# Patient Record
Sex: Female | Born: 1935 | Race: White | Hispanic: No | Marital: Married | Smoking: Former smoker
Health system: Southern US, Community
[De-identification: ages and names within clinical notes are randomized; demographics above are authoritative.]

## PROBLEM LIST (undated history)

## (undated) DIAGNOSIS — K5090 Crohn's disease, unspecified, without complications: Secondary | ICD-10-CM

## (undated) DIAGNOSIS — E039 Hypothyroidism, unspecified: Secondary | ICD-10-CM

## (undated) HISTORY — PX: CATARACT EXTRACTION: SUR2

## (undated) HISTORY — PX: EXPLORATORY LAPAROTOMY: SUR591

## (undated) HISTORY — PX: TONSILLECTOMY: SUR1361

## (undated) HISTORY — DX: Crohn's disease, unspecified, without complications: K50.90

## (undated) HISTORY — DX: Hypothyroidism, unspecified: E03.9

## (undated) HISTORY — PX: ABDOMINAL HYSTERECTOMY: SHX81

## (undated) HISTORY — PX: APPENDECTOMY: SHX54

## (undated) HISTORY — PX: OTHER SURGICAL HISTORY: SHX169

## (undated) HISTORY — PX: BREAST BIOPSY: SHX20

---

## 2014-08-19 ENCOUNTER — Encounter (INDEPENDENT_AMBULATORY_CARE_PROVIDER_SITE_OTHER): Payer: Self-pay | Admitting: *Deleted

## 2014-09-15 ENCOUNTER — Encounter (INDEPENDENT_AMBULATORY_CARE_PROVIDER_SITE_OTHER): Payer: Self-pay | Admitting: Internal Medicine

## 2014-09-15 ENCOUNTER — Ambulatory Visit (INDEPENDENT_AMBULATORY_CARE_PROVIDER_SITE_OTHER): Payer: Medicare Other | Admitting: Internal Medicine

## 2014-09-15 VITALS — BP 132/52 | HR 80 | Temp 97.7°F | Ht 67.0 in | Wt 149.2 lb

## 2014-09-15 DIAGNOSIS — K50918 Crohn's disease, unspecified, with other complication: Secondary | ICD-10-CM

## 2014-09-15 DIAGNOSIS — K5090 Crohn's disease, unspecified, without complications: Secondary | ICD-10-CM | POA: Insufficient documentation

## 2014-09-15 DIAGNOSIS — K5010 Crohn's disease of large intestine without complications: Secondary | ICD-10-CM

## 2014-09-15 DIAGNOSIS — E039 Hypothyroidism, unspecified: Secondary | ICD-10-CM | POA: Insufficient documentation

## 2014-09-15 NOTE — Patient Instructions (Addendum)
Lab work. Will try to get Remicade infusion set up. Needs PPD.

## 2014-09-15 NOTE — Progress Notes (Addendum)
   Subjective:    Patient ID: Brenda Duncan, female    DOB: [birth year], 80 y.o.   MRN: [id number]  HPI Referred to our office by [facility] for f/u of her Crohn's. Maintained on Remicade. GI physician is Dr. Aleene DavidsonSGibson Ramppainhour who has now retired. Has been seeing Dr. Aleene DavidsonSpainhour since age 80. Hx of small bowel Crohn's. She tells me she is doing good. Has been in remission since 2008 or 2009. Presently taking Remicade every 8 weeks. She is on maintenance dose of 5mg /kg. (This is documented in her records). Appetite is good. No weight loss. There is no abdominal pain. She does occasionally have diarrhea. No BRRB or melena. Usually has 6-7 BMs a day.  Does take Imodium as needed. [facility] Past hx with ileocecal resection in 2008. Has been on Remicade since 2008 per patient. She says this is the only Crohn's medication that she has ever taken.  She thinks [facility] ? [facility]??  [facility] Dr. Cliffton Astersavid A Tendler: Abnormal barium enema raising suspicion for possible sigmoid lesion: Pathology revealed: Sigmoid polyp, Multiple hyperplastic polyps. No adenomatous mucosa is seen. B;. Ileal biopsies: Small intestinal mucosa with Lymphoid Hyperplasia. No active or chronic enteritis is seen. No granulomatous inflammation is seen.  2003 Sigmoidoscopic exam with polypectomy Dr. Ree KidaJack Spainhour: Attempted colonoscopy for surveillance for terminal ileum Crohn's Findings: Multiple polyps, rectosigmoid area, all removed by cautery and hot biopsy. Could go no further than the upper sigmoid because of looping of sigmoid.  07/05/2014 H and H 13.2 and 41.2, MCV 100.7 Albumin 4.0, ALP 68, ALT 15, AST 19, total bili 0.4, Direct bili 0.0, indirect bilirubin 0.4     Review of Systems Past Medical History  Diagnosis Date  . Crohn's disease   . Hypothyroid     Past Surgical History  Procedure Laterality Date  .  Exploratory laparotomy      1982  . Removed part of bladder      88-90  . Appendectomy    . Hysterectomy    . Cataract extraction    . Exploratory laparotomy      with ileocecal resection and en BBC small bowel resection in 2008.    Allergies not on file  No current outpatient prescriptions on file prior to visit.   No current facility-administered medications on file prior to visit.        Objective:   Physical Exam  Filed Vitals:   09/15/14 1513  Height: 5\' 7"  (1.702 m)  Weight: 149 lb 3.2 oz (67.677 kg)   Alert and oriented. Skin warm and dry. Oral mucosa is moist.   . Sclera anicteric, conjunctivae is pink. Thyroid not enlarged. No cervical lymphadenopathy. Lungs clear. Heart regular rate and rhythm.  Abdomen is soft. Bowel sounds are positive. No hepatomegaly. No abdominal masses felt. No tenderness. Multiple scars to abdomen.  No edema to lower extremities.        Assessment & Plan:  Crohn's disease maintained on Remicade.  Will try to set up infusion.  Maintenance dose 5mg /kg 149.2= 67.8kg= 339mg .  PPD need to be obtained before Remicade infusion.  CBC and sedrate today

## 2014-09-16 LAB — CBC WITH DIFFERENTIAL/PLATELET
BASOS ABS: 0.1 10*3/uL (ref 0.0–0.1)
Basophils Relative: 1 % (ref 0–1)
Eosinophils Absolute: 0.1 10*3/uL (ref 0.0–0.7)
Eosinophils Relative: 2 % (ref 0–5)
HCT: 34.4 % — ABNORMAL LOW (ref 36.0–46.0)
Hemoglobin: 11.9 g/dL — ABNORMAL LOW (ref 12.0–15.0)
LYMPHS PCT: 30 % (ref 12–46)
Lymphs Abs: 2 10*3/uL (ref 0.7–4.0)
MCH: 32.5 pg (ref 26.0–34.0)
MCHC: 34.6 g/dL (ref 30.0–36.0)
MCV: 94 fL (ref 78.0–100.0)
Monocytes Absolute: 0.4 10*3/uL (ref 0.1–1.0)
Monocytes Relative: 6 % (ref 3–12)
NEUTROS ABS: 4 10*3/uL (ref 1.7–7.7)
Neutrophils Relative %: 61 % (ref 43–77)
PLATELETS: 265 10*3/uL (ref 150–400)
RBC: 3.66 MIL/uL — ABNORMAL LOW (ref 3.87–5.11)
RDW: 13.8 % (ref 11.5–15.5)
WBC: 6.6 10*3/uL (ref 4.0–10.5)

## 2014-09-16 LAB — SEDIMENTATION RATE: Sed Rate: 16 mm/hr (ref 0–22)

## 2014-09-21 NOTE — Discharge Instructions (Signed)
Infliximab injection °What is this medicine? °INFLIXIMAB (in FLIX i mab) is used to treat Crohn's disease and ulcerative colitis. It is also used to treat ankylosing spondylitis, psoriasis, and some forms of arthritis. °This medicine may be used for other purposes; ask your health care provider or pharmacist if you have questions. °COMMON BRAND NAME(S): Remicade °What should I tell my health care provider before I take this medicine? °They need to know if you have any of these conditions: °-diabetes °-exposure to tuberculosis °-heart failure °-hepatitis or liver disease °-immune system problems °-infection °-lung or breathing disease, like COPD °-multiple sclerosis °-current or past resident of [place] °-seizure disorder °-an unusual or allergic reaction to infliximab, mouse proteins, other medicines, foods, dyes, or preservatives °-pregnant or trying to get pregnant °-breast-feeding °How should I use this medicine? °This medicine is for injection into a vein. It is usually given by a health care professional in a hospital or clinic setting. °A special MedGuide will be given to you by the pharmacist with each prescription and refill. Be sure to read this information carefully each time. °Talk to your pediatrician regarding the use of this medicine in children. Special care may be needed. °Overdosage: If you think you have taken too much of this medicine contact a poison control center or emergency room at once. °NOTE: This medicine is only for you. Do not share this medicine with others. °What if I miss a dose? °It is important not to miss your dose. Call your doctor or health care professional if you are unable to keep an appointment. °What may interact with this medicine? °Do not take this medicine with any of the following medications: °-anakinra °-rilonacept °This medicine may also interact with the following medications: °-vaccines °This list may not describe all possible interactions.  Give your health care provider a list of all the medicines, herbs, non-prescription drugs, or dietary supplements you use. Also tell them if you smoke, drink alcohol, or use illegal drugs. Some items may interact with your medicine. °What should I watch for while using this medicine? °Visit your doctor or health care professional for regular checks on your progress. °If you get a cold or other infection while receiving this medicine, call your doctor or health care professional. Do not treat yourself. This medicine may decrease your body's ability to fight infections. Before beginning therapy, your doctor may do a test to see if you have been exposed to tuberculosis. °This medicine may make the symptoms of heart failure worse in some patients. If you notice symptoms such as increased shortness of breath or swelling of the ankles or legs, contact your health care provider right away. °If you are going to have surgery or dental work, tell your health care professional or dentist that you have received this medicine. °If you take this medicine for plaque psoriasis, stay out of the sun. If you cannot avoid being in the sun, wear protective clothing and use sunscreen. Do not use sun lamps or tanning beds/booths. °What side effects may I notice from receiving this medicine? °Side effects that you should report to your doctor or health care professional as soon as possible: °-allergic reactions like skin rash, itching or hives, swelling of the face, lips, or tongue °-chest pain °-fever or chills, usually related to the infusion °-muscle or joint pain °-red, scaly patches or raised bumps on the skin °-signs of infection - fever or chills, cough, sore throat, pain or difficulty passing urine °-swollen lymph nodes   in the neck, underarm, or groin areas °-unexplained weight loss °-unusual bleeding or bruising °-unusually weak or tired °-yellowing of the eyes or skin °Side effects that usually do not require medical attention  (report to your doctor or health care professional if they continue or are bothersome): °-headache °-heartburn or stomach pain °-nausea, vomiting °This list may not describe all possible side effects. Call your doctor for medical advice about side effects. You may report side effects to FDA at 1-800-FDA-1088. °Where should I keep my medicine? °This drug is given in a hospital or clinic and will not be stored at home. °NOTE: This sheet is a summary. It may not cover all possible information. If you have questions about this medicine, talk to your doctor, pharmacist, or health care provider. °© 2015, Elsevier/Gold Standard. (2008-06-16 10:26:02) ° °

## 2014-09-22 ENCOUNTER — Encounter (HOSPITAL_COMMUNITY)
Admission: RE | Admit: 2014-09-22 | Discharge: 2014-09-22 | Disposition: A | Discharge status: Home / Self Care | Payer: Medicare Other | Source: Ambulatory Visit | Attending: Internal Medicine | Admitting: Internal Medicine

## 2014-09-22 ENCOUNTER — Encounter (HOSPITAL_COMMUNITY): Payer: Self-pay

## 2014-09-22 DIAGNOSIS — K5010 Crohn's disease of large intestine without complications: Secondary | ICD-10-CM | POA: Diagnosis not present

## 2014-09-22 MED ORDER — ACETAMINOPHEN 325 MG PO TABS
650.0000 mg | ORAL_TABLET | Freq: Once | ORAL | Status: AC
  Administered 2014-09-22: 650 mg via ORAL

## 2014-09-22 MED ORDER — ACETAMINOPHEN 325 MG PO TABS
ORAL_TABLET | ORAL | Status: AC
  Filled 2014-09-22: qty 2

## 2014-09-22 MED ORDER — SODIUM CHLORIDE 0.9 % IV SOLN
Freq: Once | INTRAVENOUS | Status: AC
  Administered 2014-09-22: 250 mL via INTRAVENOUS

## 2014-09-22 MED ORDER — LORATADINE 10 MG PO TABS
10.0000 mg | ORAL_TABLET | Freq: Once | ORAL | Status: AC
Start: 2014-09-22 — End: 2014-09-22
  Administered 2014-09-22: 10 mg via ORAL

## 2014-09-22 MED ORDER — SODIUM CHLORIDE 0.9 % IV SOLN
300.0000 mg | INTRAVENOUS | Status: DC
  Administered 2014-09-22: 300 mg via INTRAVENOUS
  Filled 2014-09-22: qty 30

## 2014-09-22 MED ORDER — LORATADINE 10 MG PO TABS
ORAL_TABLET | ORAL | Status: AC
  Filled 2014-09-22: qty 1

## 2014-09-23 ENCOUNTER — Telehealth (INDEPENDENT_AMBULATORY_CARE_PROVIDER_SITE_OTHER): Payer: Self-pay | Admitting: *Deleted

## 2014-09-23 ENCOUNTER — Telehealth (INDEPENDENT_AMBULATORY_CARE_PROVIDER_SITE_OTHER): Payer: Self-pay | Admitting: Internal Medicine

## 2014-09-23 DIAGNOSIS — E038 Other specified hypothyroidism: Secondary | ICD-10-CM

## 2014-09-23 DIAGNOSIS — K5090 Crohn's disease, unspecified, without complications: Secondary | ICD-10-CM

## 2014-09-23 NOTE — Telephone Encounter (Signed)
.  Per Delrae Rend patient will need to have labs drawn in 1 month.

## 2014-09-23 NOTE — Telephone Encounter (Signed)
Labs have been noted. 

## 2014-09-23 NOTE — Addendum Note (Signed)
Addended by: Shona NeedlesDD, Torri Langston M on: 09/23/2014 09:43 AM   Modules accepted: Orders

## 2014-09-23 NOTE — Telephone Encounter (Signed)
Needs CBC, and hepatic function, and CRP in 1 month.

## 2014-09-23 NOTE — Telephone Encounter (Addendum)
Previous lab test CRP was ordered , should have been future.

## 2014-09-29 ENCOUNTER — Encounter (INDEPENDENT_AMBULATORY_CARE_PROVIDER_SITE_OTHER): Payer: Self-pay | Admitting: *Deleted

## 2014-09-29 ENCOUNTER — Other Ambulatory Visit (INDEPENDENT_AMBULATORY_CARE_PROVIDER_SITE_OTHER): Payer: Self-pay | Admitting: *Deleted

## 2014-09-29 DIAGNOSIS — K5090 Crohn's disease, unspecified, without complications: Secondary | ICD-10-CM

## 2014-09-29 DIAGNOSIS — E038 Other specified hypothyroidism: Secondary | ICD-10-CM

## 2014-10-28 LAB — CBC
HEMATOCRIT: 37.3 % (ref 36.0–46.0)
Hemoglobin: 12.5 g/dL (ref 12.0–15.0)
MCH: 31.7 pg (ref 26.0–34.0)
MCHC: 33.5 g/dL (ref 30.0–36.0)
MCV: 94.7 fL (ref 78.0–100.0)
MPV: 9.6 fL (ref 9.4–12.4)
Platelets: 295 10*3/uL (ref 150–400)
RBC: 3.94 MIL/uL (ref 3.87–5.11)
RDW: 13.8 % (ref 11.5–15.5)
WBC: 7.6 10*3/uL (ref 4.0–10.5)

## 2014-10-28 LAB — HEPATIC FUNCTION PANEL
ALT: 15 U/L (ref 0–35)
AST: 19 U/L (ref 0–37)
Albumin: 4.3 g/dL (ref 3.5–5.2)
Alkaline Phosphatase: 73 U/L (ref 39–117)
BILIRUBIN DIRECT: 0.1 mg/dL (ref 0.0–0.3)
BILIRUBIN INDIRECT: 0.3 mg/dL (ref 0.2–1.2)
Total Bilirubin: 0.4 mg/dL (ref 0.2–1.2)
Total Protein: 7.5 g/dL (ref 6.0–8.3)

## 2014-10-28 LAB — C-REACTIVE PROTEIN

## 2014-11-02 ENCOUNTER — Encounter (INDEPENDENT_AMBULATORY_CARE_PROVIDER_SITE_OTHER): Payer: Self-pay

## 2014-11-17 ENCOUNTER — Encounter (HOSPITAL_COMMUNITY)
Admission: RE | Admit: 2014-11-17 | Discharge: 2014-11-17 | Disposition: A | Discharge status: Home / Self Care | Payer: Medicare Other | Source: Ambulatory Visit | Attending: Internal Medicine | Admitting: Internal Medicine

## 2014-11-17 DIAGNOSIS — K5010 Crohn's disease of large intestine without complications: Secondary | ICD-10-CM | POA: Diagnosis not present

## 2014-11-17 MED ORDER — SODIUM CHLORIDE 0.9 % IV SOLN
300.0000 mg | Freq: Once | INTRAVENOUS | Status: AC
  Administered 2014-11-17: 300 mg via INTRAVENOUS
  Filled 2014-11-17: qty 30

## 2014-11-17 MED ORDER — ACETAMINOPHEN 325 MG PO TABS
650.0000 mg | ORAL_TABLET | Freq: Once | ORAL | Status: AC
  Administered 2014-11-17: 650 mg via ORAL

## 2014-11-17 MED ORDER — LORATADINE 10 MG PO TABS
ORAL_TABLET | ORAL | Status: AC
  Filled 2014-11-17: qty 1

## 2014-11-17 MED ORDER — LORATADINE 10 MG PO TABS
10.0000 mg | ORAL_TABLET | Freq: Every day | ORAL | Status: DC
  Administered 2014-11-17: 10 mg via ORAL

## 2014-11-17 MED ORDER — SODIUM CHLORIDE 0.9 % IV SOLN
INTRAVENOUS | Status: DC
  Administered 2014-11-17: 250 mL via INTRAVENOUS

## 2014-11-17 MED ORDER — ACETAMINOPHEN 325 MG PO TABS
ORAL_TABLET | ORAL | Status: AC
  Filled 2014-11-17: qty 2

## 2015-01-12 ENCOUNTER — Inpatient Hospital Stay (HOSPITAL_COMMUNITY): Admission: RE | Admit: 2015-01-12 | Payer: Medicare Other | Source: Ambulatory Visit

## 2015-01-17 ENCOUNTER — Encounter (HOSPITAL_COMMUNITY)
Admission: RE | Admit: 2015-01-17 | Discharge: 2015-01-17 | Disposition: A | Discharge status: Home / Self Care | Payer: Medicare Other | Source: Ambulatory Visit | Attending: Internal Medicine | Admitting: Internal Medicine

## 2015-01-17 ENCOUNTER — Encounter (HOSPITAL_COMMUNITY): Payer: Self-pay

## 2015-01-17 DIAGNOSIS — K5010 Crohn's disease of large intestine without complications: Secondary | ICD-10-CM | POA: Insufficient documentation

## 2015-01-17 MED ORDER — LORATADINE 10 MG PO TABS
10.0000 mg | ORAL_TABLET | Freq: Once | ORAL | Status: AC
  Administered 2015-01-17: 10 mg via ORAL
  Filled 2015-01-17: qty 1

## 2015-01-17 MED ORDER — ACETAMINOPHEN 325 MG PO TABS
650.0000 mg | ORAL_TABLET | Freq: Once | ORAL | Status: AC
Start: 2015-01-17 — End: 2015-01-17
  Administered 2015-01-17: 650 mg via ORAL
  Filled 2015-01-17: qty 2

## 2015-01-17 MED ORDER — SODIUM CHLORIDE 0.9 % IV SOLN
300.0000 mg | INTRAVENOUS | Status: DC
  Administered 2015-01-17: 300 mg via INTRAVENOUS
  Filled 2015-01-17: qty 30

## 2015-01-17 MED ORDER — SODIUM CHLORIDE 0.9 % IV SOLN
Freq: Once | INTRAVENOUS | Status: AC
  Administered 2015-01-17: 09:00:00 via INTRAVENOUS

## 2015-03-14 ENCOUNTER — Encounter (HOSPITAL_COMMUNITY)
Admission: RE | Admit: 2015-03-14 | Discharge: 2015-03-14 | Disposition: A | Discharge status: Home / Self Care | Payer: Medicare Other | Source: Ambulatory Visit | Attending: Internal Medicine | Admitting: Internal Medicine

## 2015-03-14 DIAGNOSIS — K5010 Crohn's disease of large intestine without complications: Secondary | ICD-10-CM | POA: Diagnosis not present

## 2015-03-14 MED ORDER — SODIUM CHLORIDE 0.9 % IV SOLN
Freq: Once | INTRAVENOUS | Status: AC
  Administered 2015-03-14: 09:00:00 via INTRAVENOUS

## 2015-03-14 MED ORDER — SODIUM CHLORIDE 0.9 % IV SOLN
300.0000 mg | Freq: Once | INTRAVENOUS | Status: AC
  Administered 2015-03-14: 300 mg via INTRAVENOUS
  Filled 2015-03-14: qty 30

## 2015-03-17 ENCOUNTER — Encounter (INDEPENDENT_AMBULATORY_CARE_PROVIDER_SITE_OTHER): Payer: Self-pay | Admitting: Internal Medicine

## 2015-03-17 ENCOUNTER — Ambulatory Visit (INDEPENDENT_AMBULATORY_CARE_PROVIDER_SITE_OTHER): Payer: Medicare Other | Admitting: Internal Medicine

## 2015-03-17 VITALS — BP 134/70 | HR 60 | Temp 98.0°F | Ht 67.0 in | Wt 147.4 lb

## 2015-03-17 DIAGNOSIS — K5090 Crohn's disease, unspecified, without complications: Secondary | ICD-10-CM | POA: Diagnosis not present

## 2015-03-17 LAB — HEPATIC FUNCTION PANEL
ALT: 13 U/L (ref 0–35)
AST: 16 U/L (ref 0–37)
Albumin: 4.1 g/dL (ref 3.5–5.2)
Alkaline Phosphatase: 60 U/L (ref 39–117)
BILIRUBIN INDIRECT: 0.3 mg/dL (ref 0.2–1.2)
Bilirubin, Direct: 0.1 mg/dL (ref 0.0–0.3)
Total Bilirubin: 0.4 mg/dL (ref 0.2–1.2)
Total Protein: 7.1 g/dL (ref 6.0–8.3)

## 2015-03-17 LAB — CBC WITH DIFFERENTIAL/PLATELET
Basophils Absolute: 0.1 10*3/uL (ref 0.0–0.1)
Basophils Relative: 1 % (ref 0–1)
EOS ABS: 0.1 10*3/uL (ref 0.0–0.7)
EOS PCT: 2 % (ref 0–5)
HEMATOCRIT: 37.8 % (ref 36.0–46.0)
Hemoglobin: 12.6 g/dL (ref 12.0–15.0)
LYMPHS ABS: 1.9 10*3/uL (ref 0.7–4.0)
Lymphocytes Relative: 28 % (ref 12–46)
MCH: 32.1 pg (ref 26.0–34.0)
MCHC: 33.3 g/dL (ref 30.0–36.0)
MCV: 96.2 fL (ref 78.0–100.0)
MONO ABS: 0.5 10*3/uL (ref 0.1–1.0)
MPV: 9.6 fL (ref 8.6–12.4)
Monocytes Relative: 7 % (ref 3–12)
Neutro Abs: 4.2 10*3/uL (ref 1.7–7.7)
Neutrophils Relative %: 62 % (ref 43–77)
Platelets: 265 10*3/uL (ref 150–400)
RBC: 3.93 MIL/uL (ref 3.87–5.11)
RDW: 13.8 % (ref 11.5–15.5)
WBC: 6.8 10*3/uL (ref 4.0–10.5)

## 2015-03-17 LAB — C-REACTIVE PROTEIN: CRP: <0.5 mg/dL (ref <0.60)

## 2015-03-17 NOTE — Patient Instructions (Signed)
Cbc, hepatic function, CRP, OV in 6 months

## 2015-03-17 NOTE — Progress Notes (Signed)
Subjective:    Patient ID: Brenda Duncan, female    DOB: 1934-01-17, 81 y.o.   MRN: [id number]  HPI Patient is here for f/u. She was last seen in November. She was a patient of Dr. Aleene Davidson. Hx of Crohn's and maintained on Remicade. Had Remicade this past Monday. Up to date on her PPD. She h;as been in remission since 2008 or 2209. She takes Remicade every 8 weeks. Maintenance dose of 5mg /kg.  Past hx with ileocecal resection in 2008. Has been on Remicade since 2008 per patient. She says this is the only Crohn's medication that she has ever taken.  She tells me she is doing well. She denies any abdominal pain. She usually has 3-8 times a day but usually 3 times a day. No melena or BRRB. Her stools are darker since starting the iron. No weight loss. No joint pain. No rashes. She stays busy gardening.   Colonoscopy 02/15/2003 [facility] Dr. Cliffton Asters Tendler: Abnormal barium enema raising suspicion for possible sigmoid lesion: Pathology revealed: Sigmoid polyp, Multiple hyperplastic polyps. No adenomatous mucosa is seen. B;. Ileal biopsies: Small intestinal mucosa with Lymphoid Hyperplasia. No active or chronic enteritis is seen. No granulomatous inflammation is seen.  2003 Sigmoidoscopic exam with polypectomy Dr. Ree Kida Spainhour: Attempted colonoscopy for surveillance for terminal ileum Crohn's Findings: Multiple polyps, rectosigmoid area, all removed by cautery and hot biopsy. Could go no further than the upper sigmoid because of looping of sigmoid    Review of Systems Past Medical History  Diagnosis Date  . Crohn's disease   . Hypothyroid     Past Surgical History  Procedure Laterality Date  . Exploratory laparotomy      1982  . Removed part of bladder      88-90  . Appendectomy    . Hysterectomy    . Cataract extraction    . Exploratory laparotomy      with ileocecal resection and en BBC small bowel resection in 2008.  . Tonsillectomy    . Abdominal  hysterectomy    . Breast biopsy Left     No Known Allergies  Current Outpatient Prescriptions on File Prior to Visit  Medication Sig Dispense Refill  . clobetasol (TEMOVATE) 0.05 % GEL Apply topically 2 (two) times daily.    Marland Kitchen inFLIXimab in sodium chloride 0.9 % Inject 5 mg/kg into the vein every 8 (eight) weeks.    Marland Kitchen levothyroxine (SYNTHROID) 125 MCG tablet Take 125 mcg by mouth daily before breakfast.    . loperamide (IMODIUM) 2 MG capsule Take by mouth as needed for diarrhea or loose stools.    . Melatonin 10 MG CAPS Take by mouth.    . Multiple Vitamins-Minerals (CENTRUM SILVER PO) Take by mouth.     No current facility-administered medications on file prior to visit.        Objective:   Physical Exam Blood pressure 134/70, pulse 60, temperature 98 F (36.7 C), height 5\' 7"  (1.702 m), weight 147 lb 6.4 oz (66.86 kg).  Alert and oriented. Skin warm and dry. Oral mucosa is moist.   . Sclera anicteric, conjunctivae is pink. Thyroid not enlarged. No cervical lymphadenopathy. Lungs clear. Heart regular rate and rhythm.  Abdomen is soft. Bowel sounds are positive. No hepatomegaly. No abdominal masses felt. No tenderness.  No edema to lower extremities.         Assessment & Plan:  Crohn's disease. She seems to be in remission. She is doing well.  Will  get a CBC and hepatic function, CRP. OV 6 months with Dr. Karilyn Cota.

## 2015-03-31 ENCOUNTER — Encounter (INDEPENDENT_AMBULATORY_CARE_PROVIDER_SITE_OTHER): Payer: Self-pay

## 2015-05-09 ENCOUNTER — Encounter (HOSPITAL_COMMUNITY)
Admission: RE | Admit: 2015-05-09 | Discharge: 2015-05-09 | Disposition: A | Discharge status: Home / Self Care | Payer: Medicare Other | Source: Ambulatory Visit | Attending: Internal Medicine | Admitting: Internal Medicine

## 2015-05-09 DIAGNOSIS — K5010 Crohn's disease of large intestine without complications: Secondary | ICD-10-CM | POA: Insufficient documentation

## 2015-05-09 MED ORDER — SODIUM CHLORIDE 0.9 % IV SOLN
Freq: Once | INTRAVENOUS | Status: AC
  Administered 2015-05-09: 200 mL via INTRAVENOUS

## 2015-05-09 MED ORDER — SODIUM CHLORIDE 0.9 % IV SOLN
300.0000 mg | INTRAVENOUS | Status: DC
  Administered 2015-05-09: 300 mg via INTRAVENOUS
  Filled 2015-05-09: qty 30

## 2015-05-09 NOTE — Progress Notes (Signed)
arived for scheduled dose remicade. Refused preop meds.

## 2015-07-11 ENCOUNTER — Encounter (HOSPITAL_COMMUNITY): Payer: Medicare Other

## 2015-07-13 ENCOUNTER — Encounter (HOSPITAL_COMMUNITY)
Admission: RE | Admit: 2015-07-13 | Discharge: 2015-07-13 | Disposition: A | Discharge status: Home / Self Care | Payer: Medicare Other | Source: Ambulatory Visit | Attending: Internal Medicine | Admitting: Internal Medicine

## 2015-07-13 DIAGNOSIS — K5010 Crohn's disease of large intestine without complications: Secondary | ICD-10-CM | POA: Diagnosis not present

## 2015-07-13 MED ORDER — SODIUM CHLORIDE 0.9 % IV SOLN
5.0000 mg/kg | Freq: Once | INTRAVENOUS | Status: AC
  Administered 2015-07-13: 300 mg via INTRAVENOUS
  Filled 2015-07-13: qty 30

## 2015-07-13 MED ORDER — SODIUM CHLORIDE 0.9 % IV SOLN
Freq: Once | INTRAVENOUS | Status: AC
  Administered 2015-07-13: 250 mL via INTRAVENOUS

## 2015-09-07 ENCOUNTER — Encounter (HOSPITAL_COMMUNITY)
Admission: RE | Admit: 2015-09-07 | Discharge: 2015-09-07 | Disposition: A | Discharge status: Home / Self Care | Payer: Medicare Other | Source: Ambulatory Visit | Attending: Internal Medicine | Admitting: Internal Medicine

## 2015-09-07 DIAGNOSIS — K5010 Crohn's disease of large intestine without complications: Secondary | ICD-10-CM | POA: Diagnosis present

## 2015-09-07 MED ORDER — ACETAMINOPHEN 325 MG PO TABS: 650.0000 mg | ORAL_TABLET | Freq: Once | ORAL | Status: DC

## 2015-09-07 MED ORDER — LORATADINE 10 MG PO TABS: 10.0000 mg | ORAL_TABLET | Freq: Once | ORAL | Status: DC

## 2015-09-07 MED ORDER — SODIUM CHLORIDE 0.9 % IV SOLN
Freq: Once | INTRAVENOUS | Status: AC
  Administered 2015-09-07: 09:00:00 via INTRAVENOUS

## 2015-09-07 MED ORDER — SODIUM CHLORIDE 0.9 % IV SOLN
300.0000 mg | Freq: Once | INTRAVENOUS | Status: AC
  Administered 2015-09-07: 300 mg via INTRAVENOUS
  Filled 2015-09-07: qty 30

## 2015-09-20 ENCOUNTER — Encounter (INDEPENDENT_AMBULATORY_CARE_PROVIDER_SITE_OTHER): Payer: Self-pay | Admitting: Internal Medicine

## 2015-09-20 ENCOUNTER — Ambulatory Visit (INDEPENDENT_AMBULATORY_CARE_PROVIDER_SITE_OTHER): Payer: Medicare Other | Admitting: Internal Medicine

## 2015-09-20 VITALS — BP 120/72 | HR 68 | Temp 98.8°F | Resp 18 | Ht 67.0 in | Wt 148.6 lb

## 2015-09-20 DIAGNOSIS — K5090 Crohn's disease, unspecified, without complications: Secondary | ICD-10-CM

## 2015-09-20 NOTE — Patient Instructions (Addendum)
Can take Imodium OTC 2 mg once or twice daily as needed.   Check with your dermatologist if lesions involving legs are erythema nodosum

## 2015-09-20 NOTE — Progress Notes (Signed)
Presenting complaint;  Follow-up for small bowel Crohn's disease.  Subjective:  Brenda Duncan is 81 year old Caucasian female was small bowel Crohn's disease and is here for scheduled visit. She remains on infliximab infusion every 8 weeks. She feels she is doing well. However she has 5-6 stools per day and if she takes Imodium she has 3-4 per day. She denies abdominal pain melena or rectal bleeding nausea or vomiting. She hasn't occasional nocturnal diarrhea. Told consistency varies between watery and mushy stools and occasionally she may have a normal stool. She has never been constipated. She believes her last colonoscopy was in 2007. She complains of rash overload extremities. She is planning to see a dermatologist in [place]. She previously has seen Dr. Denna Haggard and diagnosed with psoriasis.    Current Medications: Outpatient Encounter Prescriptions as of 09/20/2015  Medication Sig  . Calcium Citrate-Vitamin D (CALCIUM + D PO) Take by mouth daily. Patient states that she is taking the 400 mg of this calcium , unsure about the vitamin D  In this.  . clobetasol (TEMOVATE) 0.05 % GEL Apply topically daily.   . ergocalciferol (VITAMIN D2) 50000 UNITS capsule Take 50,000 Units by mouth once a week.  Marland Kitchen FLUZONE HIGH-DOSE 0.5 ML SUSY TO BE ADMINISTERED BY A PHARMACIST  . folic acid (FOLVITE) 1 MG tablet Take 1 mg by mouth daily.  Marland Kitchen inFLIXimab in sodium chloride 0.9 % Inject 5 mg/kg into the vein every 8 (eight) weeks.  Marland Kitchen levothyroxine (SYNTHROID) 125 MCG tablet Take 125 mcg by mouth daily before breakfast.  . loperamide (IMODIUM) 2 MG capsule Take by mouth as needed for diarrhea or loose stools.  . Melatonin 10 MG CAPS Take by mouth.  . Multiple Vitamins-Minerals (CENTRUM SILVER PO) Take by mouth.  . vitamin B-12 (CYANOCOBALAMIN) 500 MCG tablet Take 500 mcg by mouth daily.  . [DISCONTINUED] diphenhydrAMINE (SOMINEX) 25 MG tablet Take 25 mg by mouth at bedtime as needed for sleep.  .  [DISCONTINUED] ferrous fumarate (FERRO-SEQUELS) 50 MG CR tablet Take 45 mg by mouth 3 (three) times daily with meals.   No facility-administered encounter medications on file as of 09/20/2015.     Objective: Blood pressure 120/72, pulse 68, temperature 98.8 F (37.1 C), temperature source Oral, resp. rate 18, height 5' 7"  (1.702 m), weight 148 lb 9.6 oz (67.405 kg). Patient is alert and in no acute distress. Conjunctiva is pink. Sclera is nonicteric Oropharyngeal mucosa is normal. No neck masses or thyromegaly noted. Cardiac exam with regular rhythm normal S1 and S2. No murmur or gallop noted. Lungs are clear to auscultation. Abdomen abdomen is soft and nontender without organomegaly or masses. No LE edema or clubbing noted. She has focal nodules involving both legs with erythema to skin and mild tenderness.   Assessment:  #1. Small bowel Crohn's disease. She remains in remission. #2. Skin lesions over both legs suspicious for erythema nodosum. Will await for evaluation by her dermatologist.    Plan:  Continue Humira at current dose. Can use Imodium OTC 2 mg by mouth twice a day when necessary. She will try to find out precise data for last colonoscopy to determine when she should have next exam for screening purposes. Office visit in 6 months.

## 2015-11-03 ENCOUNTER — Encounter (HOSPITAL_COMMUNITY): Payer: Medicare Other

## 2015-11-10 ENCOUNTER — Inpatient Hospital Stay (HOSPITAL_COMMUNITY): Admission: RE | Admit: 2015-11-10 | Payer: Medicare Other | Source: Ambulatory Visit

## 2015-11-15 ENCOUNTER — Encounter (HOSPITAL_COMMUNITY)
Admission: RE | Admit: 2015-11-15 | Discharge: 2015-11-15 | Disposition: A | Discharge status: Home / Self Care | Payer: Medicare Other | Source: Ambulatory Visit | Attending: Internal Medicine | Admitting: Internal Medicine

## 2015-11-15 DIAGNOSIS — K5010 Crohn's disease of large intestine without complications: Secondary | ICD-10-CM | POA: Insufficient documentation

## 2015-11-15 MED ORDER — SODIUM CHLORIDE 0.9 % IV SOLN
300.0000 mg | Freq: Once | INTRAVENOUS | Status: AC
  Administered 2015-11-15: 300 mg via INTRAVENOUS
  Filled 2015-11-15: qty 30

## 2015-11-15 MED ORDER — LORATADINE 10 MG PO TABS: 10.0000 mg | ORAL_TABLET | Freq: Once | ORAL | Status: DC

## 2015-11-15 MED ORDER — SODIUM CHLORIDE 0.9 % IV SOLN
INTRAVENOUS | Status: DC
  Administered 2015-11-15: 09:00:00 via INTRAVENOUS

## 2015-11-15 MED ORDER — ACETAMINOPHEN 325 MG PO TABS: 650.0000 mg | ORAL_TABLET | Freq: Once | ORAL | Status: DC

## 2015-12-30 ENCOUNTER — Encounter (INDEPENDENT_AMBULATORY_CARE_PROVIDER_SITE_OTHER): Payer: Self-pay

## 2016-01-11 ENCOUNTER — Encounter (HOSPITAL_COMMUNITY)
Admission: RE | Admit: 2016-01-11 | Discharge: 2016-01-11 | Disposition: A | Discharge status: Home / Self Care | Payer: Medicare Other | Source: Ambulatory Visit | Attending: Internal Medicine | Admitting: Internal Medicine

## 2016-01-11 DIAGNOSIS — K5010 Crohn's disease of large intestine without complications: Secondary | ICD-10-CM | POA: Insufficient documentation

## 2016-01-11 MED ORDER — SODIUM CHLORIDE 0.9 % IV SOLN
INTRAVENOUS | Status: DC
  Administered 2016-01-11: 250 mL via INTRAVENOUS

## 2016-01-11 MED ORDER — SODIUM CHLORIDE 0.9 % IV SOLN
5.0000 mg/kg | INTRAVENOUS | Status: DC
  Administered 2016-01-11: 300 mg via INTRAVENOUS
  Filled 2016-01-11: qty 30

## 2016-03-07 ENCOUNTER — Ambulatory Visit (HOSPITAL_COMMUNITY)
Admission: RE | Admit: 2016-03-07 | Discharge: 2016-03-07 | Disposition: A | Discharge status: Home / Self Care | Payer: Medicare Other | Source: Ambulatory Visit | Attending: Internal Medicine | Admitting: Internal Medicine

## 2016-03-07 DIAGNOSIS — K5010 Crohn's disease of large intestine without complications: Secondary | ICD-10-CM | POA: Diagnosis present

## 2016-03-07 MED ORDER — SODIUM CHLORIDE 0.9 % IV SOLN
INTRAVENOUS | Status: DC
  Administered 2016-03-07: 250 mL via INTRAVENOUS

## 2016-03-07 MED ORDER — ACETAMINOPHEN 325 MG PO TABS: 650.0000 mg | ORAL_TABLET | Freq: Once | ORAL | Status: DC

## 2016-03-07 MED ORDER — SODIUM CHLORIDE 0.9 % IV SOLN
5.0000 mg/kg | INTRAVENOUS | Status: DC
  Administered 2016-03-07: 300 mg via INTRAVENOUS
  Filled 2016-03-07: qty 30

## 2016-03-07 MED ORDER — ACETAMINOPHEN 325 MG PO TABS
ORAL_TABLET | ORAL | Status: AC
  Filled 2016-03-07: qty 2

## 2016-03-07 MED ORDER — LORATADINE 10 MG PO TABS: 10.0000 mg | ORAL_TABLET | Freq: Once | ORAL | Status: DC

## 2016-03-07 MED ORDER — LORATADINE 10 MG PO TABS
ORAL_TABLET | ORAL | Status: AC
  Filled 2016-03-07: qty 1

## 2016-03-07 NOTE — Progress Notes (Signed)
Pt refuses tylenol and claritin.

## 2016-03-20 ENCOUNTER — Ambulatory Visit (INDEPENDENT_AMBULATORY_CARE_PROVIDER_SITE_OTHER): Payer: Medicare Other | Admitting: Internal Medicine

## 2016-03-20 ENCOUNTER — Encounter (INDEPENDENT_AMBULATORY_CARE_PROVIDER_SITE_OTHER): Payer: Self-pay | Admitting: Internal Medicine

## 2016-03-20 VITALS — BP 132/68 | HR 78 | Temp 97.1°F | Resp 18 | Ht 67.0 in | Wt 150.2 lb

## 2016-03-20 DIAGNOSIS — R197 Diarrhea, unspecified: Secondary | ICD-10-CM

## 2016-03-20 DIAGNOSIS — K5090 Crohn's disease, unspecified, without complications: Secondary | ICD-10-CM

## 2016-03-20 NOTE — Progress Notes (Signed)
Presenting complaint;  Follow-up for small bowel Crohn's disease.  Subjective:  Patient is 82 year old Caucasian female who is here for scheduled visit. She was last seen 6 months ago when she weighed 148 pounds. She states she is doing well she is having anywhere from 3-10 stools per day. She is using Imodium on as-needed basis. She denies abdominal pain melena or rectal bleeding or nocturnal diarrhea. She has very good appetite. She saw Dr. Alford Highland dermatologist in [place] and had injection therapy for skin lesion and they have all resolved. She states her last colonoscopy was in 2004 at [facility]. She states she would be 82 there this month and not interested in having any more colonoscopies. She had blood work few weeks ago and was normal. She forgot to bring copy with her.   Current Medications: Outpatient Encounter Prescriptions as of 03/20/2016  Medication Sig  . Calcium Citrate-Vitamin D (CALCIUM + D PO) Take by mouth daily. Patient states that she is taking the 400 mg of this calcium , unsure about the vitamin D  In this.  . clobetasol (TEMOVATE) 0.05 % GEL Apply topically daily as needed.   . ergocalciferol (VITAMIN D2) 50000 UNITS capsule Take 50,000 Units by mouth once a week.  Marland Kitchen FLUZONE HIGH-DOSE 0.5 ML SUSY TO BE ADMINISTERED BY A PHARMACIST  . folic acid (FOLVITE) 1 MG tablet Take 1 mg by mouth daily.  Marland Kitchen inFLIXimab in sodium chloride 0.9 % Inject 5 mg/kg into the vein every 8 (eight) weeks.  Marland Kitchen levothyroxine (SYNTHROID) 125 MCG tablet Take 150 mcg by mouth daily before breakfast.   . loperamide (IMODIUM) 2 MG capsule Take by mouth as needed for diarrhea or loose stools.  . Multiple Vitamins-Minerals (CENTRUM SILVER PO) Take by mouth.  . vitamin B-12 (CYANOCOBALAMIN) 500 MCG tablet Take 500 mcg by mouth daily.  . [DISCONTINUED] Melatonin 10 MG CAPS Take by mouth. Reported on 03/20/2016   No facility-administered encounter medications on file as of 03/20/2016.      Objective: Blood pressure 132/68, pulse 78, temperature 97.1 F (36.2 C), temperature source Oral, resp. rate 18, height 5' 7"  (1.702 m), weight 150 lb 3.2 oz (68.13 kg). Patient is alert and in no acute distress. Conjunctiva is pink. Sclera is nonicteric Oropharyngeal mucosa is normal. No neck masses or thyromegaly noted. Cardiac exam with regular rhythm normal S1 and S2. No murmur or gallop noted. Lungs are clear to auscultation. Abdomen is symmetrical soft and nontender without organomegaly or masses. No LE edema or clubbing noted.  Labs/studies Results: Last blood work on file is from 11/23/2015  WBC was 7.1 H&H 13.0 and 41.4 and platelet count 296K. Creatinine was 0.69 LFTs normal.  Assessment:  #1. Small bowel Crohn's disease. She had surgery about 9 years ago. She remains in remission and she is tolerating infliximab without any problems. She will continue this medication as long as his working and she has no side effects. #2. 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 She needs to take loperamide and schedule rather than when necessary. If it does not work we'll try Questran at a low dose. #3. Screening for CRC. Last colonoscopy was 13 years ago at [facility] with removal a few polyps but there were hyperplastic. Patient is not interested in undergoing colonoscopy at this time.   Plan:  Patient advised to take loperamide 2 mg every morning and second dose before lunch on as-needed basis. Continue infliximab at current dose which is 5 mg/kg every 8  weeks. Office visit in 6 months or earlier if needed. Patient will try to get copy of recent blood work faxed to her office.

## 2016-03-20 NOTE — Patient Instructions (Signed)
Take Imodium OTC 2 mg by mouth at breakfast daily and second dose with lunch if needed.

## 2016-05-02 ENCOUNTER — Encounter (HOSPITAL_COMMUNITY)
Admission: RE | Admit: 2016-05-02 | Discharge: 2016-05-02 | Disposition: A | Discharge status: Home / Self Care | Payer: Medicare Other | Source: Ambulatory Visit | Attending: Internal Medicine | Admitting: Internal Medicine

## 2016-05-02 DIAGNOSIS — K5090 Crohn's disease, unspecified, without complications: Secondary | ICD-10-CM | POA: Insufficient documentation

## 2016-05-02 MED ORDER — SODIUM CHLORIDE 0.9 % IV SOLN
Freq: Once | INTRAVENOUS | Status: AC
  Administered 2016-05-02: 250 mL via INTRAVENOUS

## 2016-05-02 MED ORDER — SODIUM CHLORIDE 0.9 % IV SOLN
5.0000 mg/kg | INTRAVENOUS | Status: DC
  Administered 2016-05-02: 300 mg via INTRAVENOUS
  Filled 2016-05-02: qty 30

## 2016-05-02 NOTE — Progress Notes (Signed)
Pt refuses tylenol and claritin. 

## 2016-06-27 ENCOUNTER — Encounter (HOSPITAL_COMMUNITY)
Admission: RE | Admit: 2016-06-27 | Discharge: 2016-06-27 | Disposition: A | Discharge status: Home / Self Care | Payer: Medicare Other | Source: Ambulatory Visit | Attending: Internal Medicine | Admitting: Internal Medicine

## 2016-06-27 DIAGNOSIS — K50918 Crohn's disease, unspecified, with other complication: Secondary | ICD-10-CM | POA: Insufficient documentation

## 2016-06-27 MED ORDER — SODIUM CHLORIDE 0.9 % IV SOLN
INTRAVENOUS | Status: DC
  Administered 2016-06-27: 08:00:00 via INTRAVENOUS

## 2016-06-27 MED ORDER — SODIUM CHLORIDE 0.9 % IV SOLN
5.0000 mg/kg | INTRAVENOUS | Status: DC
  Administered 2016-06-27: 300 mg via INTRAVENOUS
  Filled 2016-06-27: qty 30

## 2016-08-22 ENCOUNTER — Encounter (HOSPITAL_COMMUNITY)
Admission: RE | Admit: 2016-08-22 | Discharge: 2016-08-22 | Disposition: A | Discharge status: Home / Self Care | Payer: Medicare Other | Source: Ambulatory Visit | Attending: Internal Medicine | Admitting: Internal Medicine

## 2016-08-22 DIAGNOSIS — K5090 Crohn's disease, unspecified, without complications: Secondary | ICD-10-CM | POA: Insufficient documentation

## 2016-08-22 MED ORDER — SODIUM CHLORIDE 0.9 % IV SOLN
5.0000 mg/kg | Freq: Once | INTRAVENOUS | Status: AC
  Administered 2016-08-22: 300 mg via INTRAVENOUS
  Filled 2016-08-22: qty 30

## 2016-08-22 MED ORDER — SODIUM CHLORIDE 0.9 % IV SOLN
INTRAVENOUS | Status: DC
  Administered 2016-08-22: 250 mL via INTRAVENOUS

## 2016-09-25 ENCOUNTER — Encounter (INDEPENDENT_AMBULATORY_CARE_PROVIDER_SITE_OTHER): Payer: Self-pay

## 2016-09-25 ENCOUNTER — Ambulatory Visit (INDEPENDENT_AMBULATORY_CARE_PROVIDER_SITE_OTHER): Payer: Medicare Other | Admitting: Internal Medicine

## 2016-09-25 ENCOUNTER — Encounter (INDEPENDENT_AMBULATORY_CARE_PROVIDER_SITE_OTHER): Payer: Self-pay | Admitting: Internal Medicine

## 2016-09-25 VITALS — BP 130/70 | HR 72 | Temp 97.9°F | Resp 18 | Ht 67.0 in | Wt 143.0 lb

## 2016-09-25 DIAGNOSIS — K5000 Crohn's disease of small intestine without complications: Secondary | ICD-10-CM | POA: Diagnosis not present

## 2016-09-25 DIAGNOSIS — R42 Dizziness and giddiness: Secondary | ICD-10-CM | POA: Diagnosis not present

## 2016-09-25 DIAGNOSIS — R634 Abnormal weight loss: Secondary | ICD-10-CM

## 2016-09-25 LAB — CBC WITH DIFFERENTIAL/PLATELET
BASOS PCT: 0 %
Basophils Absolute: 0 cells/uL (ref 0–200)
EOS PCT: 1 %
Eosinophils Absolute: 75 cells/uL (ref 15–500)
HCT: 37.6 % (ref 35.0–45.0)
HEMOGLOBIN: 12.5 g/dL (ref 11.7–15.5)
LYMPHS ABS: 1650 {cells}/uL (ref 850–3900)
Lymphocytes Relative: 22 %
MCH: 32.6 pg (ref 27.0–33.0)
MCHC: 33.2 g/dL (ref 32.0–36.0)
MCV: 97.9 fL (ref 80.0–100.0)
MONOS PCT: 8 %
MPV: 9.2 fL (ref 7.5–12.5)
Monocytes Absolute: 600 cells/uL (ref 200–950)
NEUTROS ABS: 5175 {cells}/uL (ref 1500–7800)
Neutrophils Relative %: 69 %
PLATELETS: 249 10*3/uL (ref 140–400)
RBC: 3.84 MIL/uL (ref 3.80–5.10)
RDW: 13.4 % (ref 11.0–15.0)
WBC: 7.5 10*3/uL (ref 3.8–10.8)

## 2016-09-25 LAB — TSH: TSH: 0.94 m[IU]/L

## 2016-09-25 NOTE — Patient Instructions (Signed)
Physician will call with results of blood tests when completed.

## 2016-09-25 NOTE — Progress Notes (Signed)
Presenting complaint;  Follow-up for Crohn's disease. Patient complains of dizzy spells  Subjective:  Patient is 82 year old Caucasian female with small bowel Crohn's disease was here for scheduled visit. She was last seen on 03/30/2016. Since her last visit she has lost 7 pounds. She feels weight loss is voluntary because she changed eating habits in order to bring her triglycerides down. She states she has very good appetite. She takes one Imodium every day. She has 3 bowel movements per day. Previously she was having anywhere from 3-10 bowel movements per day. More than 50% of her stools are formed. She denies abdominal pain melena or rectal bleeding. She stays active. She walks 2-3 times a week and each time she walks at least a mile. She denies heartburn nausea or vomiting. She does not feel that she is having any side effects with infliximab. Her new complaint is one of dizzy spells. She had 26 disease spells while at beauty shop last week. She does not remember that she turned suddenly stood up from stooping posterior. She had staggered. She did not feel lightheaded or felt like she was going to pass out. She had another spell 2 days ago while she was at a grocery store. She just states still and felt better. She denies vertigo.   Current Medications: Outpatient Encounter Prescriptions as of 09/25/2016  Medication Sig  . ergocalciferol (VITAMIN D2) 50000 UNITS capsule Take 50,000 Units by mouth once a week.  Marland Kitchen FLUZONE HIGH-DOSE 0.5 ML SUSY TO BE ADMINISTERED BY A PHARMACIST  . folic acid (FOLVITE) 1 MG tablet Take 1 mg by mouth daily.  Marland Kitchen inFLIXimab in sodium chloride 0.9 % Inject 5 mg/kg into the vein every 8 (eight) weeks.  Marland Kitchen loperamide (IMODIUM) 2 MG capsule Take by mouth as needed for diarrhea or loose stools.  . Multiple Minerals-Vitamins (CALCIUM CITRATE PLUS/MAGNESIUM) TABS Take by mouth daily. This also includes Zinc.  . Multiple Vitamins-Minerals (CENTRUM SILVER PO) Take by mouth.   . Omega-3 Fatty Acids (FISH OIL PO) Take 1,400 mg by mouth daily.  Marland Kitchen SYNTHROID 150 MCG tablet Take 150 mcg by mouth daily before breakfast.   . vitamin B-12 (CYANOCOBALAMIN) 500 MCG tablet Take 500 mcg by mouth daily.  . [DISCONTINUED] Calcium Citrate-Vitamin D (CALCIUM + D PO) Take by mouth daily. Patient states that she is taking the 400 mg of this calcium , unsure about the vitamin D  In this.  . [DISCONTINUED] clobetasol (TEMOVATE) 0.05 % GEL Apply topically daily as needed.   . [DISCONTINUED] levothyroxine (SYNTHROID) 125 MCG tablet Take 150 mcg by mouth daily before breakfast.   . [DISCONTINUED] triamcinolone acetonide (KENALOG) 10 MG/ML injection Inject 10 mg into the muscle every 8 (eight) weeks. Injected every 6-8 weeks for skin lesions   No facility-administered encounter medications on file as of 09/25/2016.      Objective: Blood pressure 130/70, pulse 72, temperature 97.9 F (36.6 C), temperature source Oral, resp. rate 18, height 5' 7"  (1.702 m), weight 143 lb (64.9 kg). Patient is alert and in no acute distress. Conjunctiva is pink. Sclera is nonicteric Oropharyngeal mucosa is normal. No neck masses or thyromegaly noted. Cardiac exam with regular rhythm normal S1 and S2. No murmur or gallop noted. Lungs are clear to auscultation. Abdomen is full with lower midline and right horizontal mid abdominal scars. Abdomen is soft and nontender without organomegaly or masses. No LE edema or clubbing noted.  Labs/studies Results: Lab data from 05/18/2016  WBC 6.55, H&H 13.3 and 40.6.  Platelet count 251K.  BUN 10 and creatinine 0.63.  Bilirubin 0.5, APB 60, AST 20, ALT 24, total protein 7.5 and albumin 3.8.  Serum calcium 9.1.  TSH 0.83 (TSH was 19.3 on 11/23/2015).   Assessment:  #1. Small bowel Crohn's disease. Patient remains on infliximab for maintenance. She is in remission. Stool frequency has slowed with 2-4 mg of loperamide per day. #2. Weight loss. She has lost 7  pounds since her last visit. Synthroid dose was increased by Dr. Shearon Stalls in January 2017 and TSH was normal in July 2017. #3. Dizziness. Dizziness may be due to inner ear disease. He does not have postural symptoms. As H&H was normal. Will rule out anemia.  Plan:  CBC with differential. TSH. Patient will continue infliximab at current dose of 5 mg per KG every 8 weeks. OV in 6 months.

## 2016-10-01 ENCOUNTER — Encounter (INDEPENDENT_AMBULATORY_CARE_PROVIDER_SITE_OTHER): Payer: Self-pay

## 2016-10-17 ENCOUNTER — Encounter (HOSPITAL_COMMUNITY): Payer: Medicare Other

## 2016-11-07 ENCOUNTER — Inpatient Hospital Stay (HOSPITAL_COMMUNITY): Admission: RE | Admit: 2016-11-07 | Payer: Medicare Other | Source: Ambulatory Visit

## 2016-11-19 ENCOUNTER — Telehealth (INDEPENDENT_AMBULATORY_CARE_PROVIDER_SITE_OTHER): Payer: Self-pay | Admitting: *Deleted

## 2016-11-19 NOTE — Telephone Encounter (Signed)
Patient called and states that she was unable to have her IV at the hospital. That she was sick and would like talk to Dr.Rehman or his nurse. She was called and a message left asking her to call us back.

## 2016-11-20 ENCOUNTER — Encounter (HOSPITAL_COMMUNITY): Admission: RE | Admit: 2016-11-20 | Payer: Medicare Other | Source: Ambulatory Visit

## 2016-11-21 ENCOUNTER — Telehealth (INDEPENDENT_AMBULATORY_CARE_PROVIDER_SITE_OTHER): Payer: Self-pay | Admitting: *Deleted

## 2016-11-21 NOTE — Telephone Encounter (Signed)
Forwarded to Dr.Rehman to review. 

## 2016-11-21 NOTE — Telephone Encounter (Signed)
Patient is asking , how long she can go without Remicade and be safe? She says that it has been a month since was infused. Patient has been sick with bronchitis and was on antibiotics and was told to wait 2 weeks after having been on them to have the infusion. Patient had to have a bronchoscopy, then was in the hospital. Most recent , she had bent over to do something , and she coughed. Felt something pop in her back. She saw a doctor , had MRI and is going today to have it read and to see what the doctor is going to recommend.  If not admitted she will be home later this evening, and would appreciate a call back with the response from Dr.Rehman about the Remicade Infusion. [phone]. Patient was advised that either Dr.Rehman or myself would call her back.

## 2016-11-21 NOTE — Telephone Encounter (Signed)
Patient called back .  This is documented on another telephone encounter.

## 2016-11-27 NOTE — Telephone Encounter (Signed)
Talked with patient. Okay to delay Remicade infusion while she is recovering from pneumonia. She will call to arrange infusion when she is ready.

## 2016-12-07 ENCOUNTER — Encounter (HOSPITAL_COMMUNITY)
Admission: RE | Admit: 2016-12-07 | Discharge: 2016-12-07 | Disposition: A | Discharge status: Home / Self Care | Payer: Medicare Other | Source: Ambulatory Visit | Attending: Internal Medicine | Admitting: Internal Medicine

## 2016-12-07 DIAGNOSIS — K5090 Crohn's disease, unspecified, without complications: Secondary | ICD-10-CM | POA: Insufficient documentation

## 2016-12-07 MED ORDER — SODIUM CHLORIDE 0.9 % IV SOLN
300.0000 mg | Freq: Once | INTRAVENOUS | Status: AC
  Administered 2016-12-07: 300 mg via INTRAVENOUS
  Filled 2016-12-07: qty 30

## 2016-12-07 MED ORDER — SODIUM CHLORIDE 0.9 % IV SOLN
INTRAVENOUS | Status: DC
  Administered 2016-12-07: 250 mL via INTRAVENOUS

## 2016-12-07 NOTE — Progress Notes (Signed)
Pt refuses tylenol and claritin. 

## 2017-01-02 LAB — PULMONARY FUNCTION TEST

## 2017-01-07 ENCOUNTER — Telehealth (INDEPENDENT_AMBULATORY_CARE_PROVIDER_SITE_OTHER): Payer: Self-pay | Admitting: Internal Medicine

## 2017-01-07 NOTE — Telephone Encounter (Signed)
Patient called, has been sick, been diagnosed with COPD.  She would like to talk to you about her Remicade.  She doesn't want to stop it, but immune system is low.  [phone]

## 2017-01-07 NOTE — Telephone Encounter (Signed)
Patient has been advised by Dr.O'Neil that she has a low immunity system. She states that she has been through a lot. Her question is can she delay Remicade for 6 months? She would like to talk with Dr.Rehman about all this, she does not want to stop it. Call back number is [phone].

## 2017-01-08 NOTE — Telephone Encounter (Signed)
Patient's call returned. Patient states she developed pneumonia and was hospitalized for 1 week. She was told by her pulmonologist that her immune system is compromised. She does not want to come off Remicade but wants to stretch the interval to every 6 months if possible. I recommended changing infusion to every 12 weeks. Next dose would be due on 03/07/2017. She will have drug level prior to next dose to help guide with treatment schedule.

## 2017-01-10 NOTE — Telephone Encounter (Signed)
A new Remicade order has been completed.Patient will need to have a lab prior to her next Remicde which is due 04/26/208. A order will be done for this and the patient will be made aware.

## 2017-02-04 ENCOUNTER — Encounter (HOSPITAL_COMMUNITY): Payer: Medicare Other

## 2017-02-15 ENCOUNTER — Other Ambulatory Visit (INDEPENDENT_AMBULATORY_CARE_PROVIDER_SITE_OTHER): Payer: Self-pay | Admitting: *Deleted

## 2017-03-06 ENCOUNTER — Encounter (INDEPENDENT_AMBULATORY_CARE_PROVIDER_SITE_OTHER): Payer: Self-pay

## 2017-03-07 ENCOUNTER — Encounter (HOSPITAL_COMMUNITY): Payer: Medicare Other

## 2017-03-26 ENCOUNTER — Encounter (INDEPENDENT_AMBULATORY_CARE_PROVIDER_SITE_OTHER): Payer: Self-pay | Admitting: Internal Medicine

## 2017-03-26 ENCOUNTER — Ambulatory Visit (INDEPENDENT_AMBULATORY_CARE_PROVIDER_SITE_OTHER): Payer: Medicare Other | Admitting: Internal Medicine

## 2017-03-26 VITALS — BP 110/68 | HR 70 | Temp 98.1°F | Resp 18 | Ht 67.0 in | Wt 140.6 lb

## 2017-03-26 DIAGNOSIS — K5000 Crohn's disease of small intestine without complications: Secondary | ICD-10-CM | POA: Diagnosis not present

## 2017-03-26 NOTE — Patient Instructions (Signed)
Physician will call with results of blood test. Call if you have abdominal pain or rectal bleeding

## 2017-03-26 NOTE — Progress Notes (Signed)
Presenting complaint;  Follow-up for Crohn's disease.  Database and Subjective:  Patient is 83 year old Caucasian female was several year history of Crohn's disease involving small and large bowel was remained in remission while on infliximab. Last dose was around 14 weeks ago. Over 2 months ago she developed pneumonia and end up in the hospital. She has no recovery. It was decided to hold off infliximab. Patient returns for scheduled visit. She states finally she feels well. She feels she has fully recovered from her pneumonia. Now she is using oxygen only at bedtime. She is hoping that O2 will be discontinued when she follows up with her primary physician. She denies abdominal pain or melena. She remains with chronic diarrhea. She generally has 5-6 bowel movements per day. She uses Imodium on as-needed basis. She has very good appetite. She states she lost 19 pounds during her sickness but she has gained most of this back. Her weight is only down by 3 pounds since her last visit about 6 months ago.  Current Medications: Outpatient Encounter Prescriptions as of 03/26/2017  Medication Sig  . ergocalciferol (VITAMIN D2) 50000 UNITS capsule Take 50,000 Units by mouth once a week.  Marland Kitchen FLUZONE HIGH-DOSE 0.5 ML SUSY TO BE ADMINISTERED BY A PHARMACIST  . folic acid (FOLVITE) 1 MG tablet Take 800 mcg by mouth daily.   Marland Kitchen loperamide (IMODIUM) 2 MG capsule Take by mouth as needed for diarrhea or loose stools.  . Multiple Minerals-Vitamins (CALCIUM CITRATE PLUS/MAGNESIUM) TABS Take by mouth daily. This also includes Zinc.  . Omega-3 Fatty Acids (FISH OIL PO) Take 1,400 mg by mouth daily.  . OXYGEN Inhale 2 L into the lungs at bedtime.  Marland Kitchen SYNTHROID 150 MCG tablet Take 150 mcg by mouth daily before breakfast.   . VENTOLIN HFA 108 (90 Base) MCG/ACT inhaler Inhale 2 puffs into the lungs as needed.   . vitamin B-12 (CYANOCOBALAMIN) 500 MCG tablet Take 500 mcg by mouth daily.  Marland Kitchen inFLIXimab in sodium chloride 0.9  % Inject 5 mg/kg into the vein every 8 (eight) weeks.  . [DISCONTINUED] Multiple Vitamins-Minerals (CENTRUM SILVER PO) Take by mouth.   No facility-administered encounter medications on file as of 03/26/2017.      Objective: Blood pressure 110/68, pulse 70, temperature 98.1 F (36.7 C), temperature source Oral, resp. rate 18, height 5' 7"  (1.702 m), weight 140 lb 9.6 oz (63.8 kg). Patient is alert and in no acute distress. Conjunctiva is pink. Sclera is nonicteric Oropharyngeal mucosa is normal. No neck masses or thyromegaly noted. Cardiac exam with regular rhythm normal S1 and S2. No murmur or gallop noted. Lungs are clear to auscultation. Abdomen is symmetrical soft and nontender without organomegaly or masses. No LE edema or clubbing noted.   Assessment:  #1. Crohn's disease of small and large bowel. Patient remains in remission. Last infliximab dose was about 14 weeks ago. So far so good. She has concerns about infliximab and therefore will continue to hold this medication and hopefully she will remain in remission and not need any more therapy.  Plan:  Patient will go to lab for CRP. Presuming this is normal she will have it repeated by  Dr. Shearon Stalls in July 2018. Continue to hold infliximab. Patient will call if she has abdominal pain or rectal bleeding. Office visit in 6 months.

## 2017-03-29 ENCOUNTER — Inpatient Hospital Stay (HOSPITAL_COMMUNITY): Admission: RE | Admit: 2017-03-29 | Payer: Medicare Other | Source: Ambulatory Visit

## 2017-04-02 LAB — C-REACTIVE PROTEIN: CRP: 1.1 mg/L (ref <8.0)

## 2017-04-09 ENCOUNTER — Other Ambulatory Visit (INDEPENDENT_AMBULATORY_CARE_PROVIDER_SITE_OTHER): Payer: Self-pay | Admitting: *Deleted

## 2017-04-09 DIAGNOSIS — K5090 Crohn's disease, unspecified, without complications: Secondary | ICD-10-CM

## 2017-04-11 ENCOUNTER — Telehealth (INDEPENDENT_AMBULATORY_CARE_PROVIDER_SITE_OTHER): Payer: Self-pay | Admitting: Internal Medicine

## 2017-04-11 NOTE — Telephone Encounter (Signed)
Patient called, stated that she believes she missed a call from Dr. Laural Golden.  [phone]

## 2017-04-11 NOTE — Telephone Encounter (Signed)
Patient was called and given her CRP result.

## 2017-05-08 ENCOUNTER — Encounter (INDEPENDENT_AMBULATORY_CARE_PROVIDER_SITE_OTHER): Payer: Self-pay | Admitting: *Deleted

## 2017-05-08 ENCOUNTER — Other Ambulatory Visit (INDEPENDENT_AMBULATORY_CARE_PROVIDER_SITE_OTHER): Payer: Self-pay | Admitting: *Deleted

## 2017-05-08 DIAGNOSIS — K5090 Crohn's disease, unspecified, without complications: Secondary | ICD-10-CM

## 2017-09-26 ENCOUNTER — Ambulatory Visit (INDEPENDENT_AMBULATORY_CARE_PROVIDER_SITE_OTHER): Payer: Medicare Other | Admitting: Internal Medicine

## 2017-10-01 ENCOUNTER — Ambulatory Visit (INDEPENDENT_AMBULATORY_CARE_PROVIDER_SITE_OTHER): Payer: Medicare Other | Admitting: Internal Medicine

## 2018-01-08 ENCOUNTER — Telehealth (INDEPENDENT_AMBULATORY_CARE_PROVIDER_SITE_OTHER): Payer: Self-pay | Admitting: *Deleted

## 2018-01-08 NOTE — Telephone Encounter (Signed)
Patient called wanting to know if Dr Karilyn Cota needs to check her blood again and patient states she has blood work but she is unsure if this is what Dr Karilyn Cota would want. Call patient after 2:00 in the afternoon.  Please advise

## 2018-01-10 NOTE — Telephone Encounter (Signed)
I will address this with Dr.Rehman on 01/13/2018.

## 2018-01-14 NOTE — Telephone Encounter (Signed)
Patient brought a copy of her lab work for Dr.Rehman to review. After the review of the lab work , she would like to know if she will need additional lab work and a office visit.

## 2018-01-20 NOTE — Telephone Encounter (Signed)
Labs reviewed and patient called.

## 2018-03-25 ENCOUNTER — Ambulatory Visit (INDEPENDENT_AMBULATORY_CARE_PROVIDER_SITE_OTHER): Payer: Medicare Other | Admitting: Internal Medicine

## 2018-03-25 ENCOUNTER — Encounter (INDEPENDENT_AMBULATORY_CARE_PROVIDER_SITE_OTHER): Payer: Self-pay | Admitting: Internal Medicine

## 2018-03-25 VITALS — BP 138/68 | HR 70 | Temp 97.7°F | Resp 18 | Ht 67.0 in | Wt 152.4 lb

## 2018-03-25 DIAGNOSIS — K5000 Crohn's disease of small intestine without complications: Secondary | ICD-10-CM

## 2018-03-25 DIAGNOSIS — K529 Noninfective gastroenteritis and colitis, unspecified: Secondary | ICD-10-CM | POA: Diagnosis not present

## 2018-03-25 NOTE — Progress Notes (Signed)
Presenting complaint;  Follow-up for Crohn's disease.  Subjective:  Patient is 84 year old Caucasian female who history of small bowel Crohn's disease with right hemicolectomy in 2008 who was on infliximab until January 2018 when medication was stopped because of concern for prolonged illness with pneumonia. She was last seen 1 year ago and was doing well. She states she is doing well.  She takes 2 mg of Imodium every morning.  With Imodium she has 4-5 stools per day.  50% of her stools are formed.  She does not have melena or rectal bleeding.  She also denies urgency or accidents.  She may have nocturnal bowel movement once in a while.  She has good appetite.  She has gained 12 pounds since her last visit.  She states she had lost a lot of weight when she had prolonged illness with pneumonia year before last. She has been advised by her physician to undergo colonoscopy.  She had polyps removed by Dr. Donna Christen her back in 2003.  We do not have path report on this.  She had colonoscopy at [facility] in April 2004 at which time TI was normal and she had hyperplastic polyps.  Current Medications: Outpatient Encounter Medications as of 03/25/2018  Medication Sig  . arformoterol (BROVANA) 15 MCG/2ML NEBU Take 15 mcg by nebulization daily.  . ergocalciferol (VITAMIN D2) 50000 UNITS capsule Take 50,000 Units by mouth once a week.  Marland Kitchen FLUZONE HIGH-DOSE 0.5 ML SUSY TO BE ADMINISTERED BY A PHARMACIST  . loperamide (IMODIUM) 2 MG capsule Take by mouth as needed for diarrhea or loose stools.  . OXYGEN Inhale 2 L into the lungs at bedtime.  Marland Kitchen SYNTHROID 150 MCG tablet Take 150 mcg by mouth daily before breakfast.   . VENTOLIN HFA 108 (90 Base) MCG/ACT inhaler Inhale 2 puffs into the lungs as needed.   . [DISCONTINUED] Multiple Minerals-Vitamins (CALCIUM CITRATE PLUS/MAGNESIUM) TABS Take by mouth daily. This also includes Zinc.  . [DISCONTINUED] folic acid (FOLVITE) 1 MG tablet Take 800 mcg by mouth daily.   .  [DISCONTINUED] Omega-3 Fatty Acids (FISH OIL PO) Take 1,400 mg by mouth daily.  . [DISCONTINUED] vitamin B-12 (CYANOCOBALAMIN) 500 MCG tablet Take 500 mcg by mouth daily.   No facility-administered encounter medications on file as of 03/25/2018.      Objective: Blood pressure 138/68, pulse 70, temperature 97.7 F (36.5 C), temperature source Oral, resp. rate 18, height 5' 7"  (1.702 m), weight 152 lb 6.4 oz (69.1 kg). Patient is alert and in no acute distress. Conjunctiva is pink. Sclera is nonicteric Oropharyngeal mucosa is normal. No neck masses or thyromegaly noted. Cardiac exam with regular rhythm normal S1 and S2. No murmur or gallop noted. Lungs are clear to auscultation. Abdomen is symmetrical.  She has lower midline scar as well as horizontal scar in right mid abdomen.  On palpation abdomen is soft and nontender without organomegaly or masses. No LE edema or clubbing noted.  Labs/studies Results: Lab data from 12/13/2007 vitamin B12 285 which is normal. WBC 9.68, H&H 13 and 40.8 and platelet count 302K. Electrolytes within normal limits. BUN 25 and creatinine 0.64. Bilirubin 0.4, AP 71, AST 11, ALT 21 total protein 7.6 and albumin 4.0. Calcium 8.8.  Assessment:  #1.  History of small bowel Crohn's disease.  She is status post right hemicolectomy in 2008.  She has been off infliximab for over 15 months.  She has chronic diarrhea felt to be due to prior bowel surgery.  CRP last year was  normal.  Clinically she remains in remission.    #2.  Chronic diarrhea secondary to right hemicolectomy.  I do not believe diarrhea is due to relapse of her IBD since she has no other symptoms.  We will continue to treat her symptomatically.  #3.  History of colonic polyps.  Review of records from [facility] revealed that she had hyperplastic polyps.  Will request path report on polyps removed in 2003  And determine if she should undergo endoscopic evaluation otherwise.  She is willing to undergo virtual  colonoscopy and reluctant to proceed with colonoscopy.   Plan:  Try Imodium OTC 2 mg p.o. twice daily. Will request pathology report on polyps removed from 2003. Will schedule virtual colonoscopy after above reviewed. Patient will call if she has rectal bleeding or diarrhea not controlled with Imodium. Office visit in 1 year.

## 2018-03-25 NOTE — Patient Instructions (Signed)
Can take Imodium OTC up to 2 mg twice daily. Notify if you have rectal bleeding. Virtual colonoscopy to be scheduled.

## 2019-04-20 ENCOUNTER — Ambulatory Visit (INDEPENDENT_AMBULATORY_CARE_PROVIDER_SITE_OTHER): Payer: Medicare Other | Admitting: Internal Medicine

## 2019-07-28 ENCOUNTER — Ambulatory Visit (INDEPENDENT_AMBULATORY_CARE_PROVIDER_SITE_OTHER): Payer: Medicare Other | Admitting: Internal Medicine

## 2019-07-28 ENCOUNTER — Encounter (INDEPENDENT_AMBULATORY_CARE_PROVIDER_SITE_OTHER): Payer: Self-pay | Admitting: Internal Medicine

## 2019-07-28 ENCOUNTER — Other Ambulatory Visit: Payer: Self-pay

## 2019-07-28 VITALS — BP 173/87 | HR 72 | Temp 98.2°F | Ht 67.0 in | Wt 143.2 lb

## 2019-07-28 DIAGNOSIS — K5000 Crohn's disease of small intestine without complications: Secondary | ICD-10-CM | POA: Diagnosis not present

## 2019-07-28 NOTE — Progress Notes (Signed)
Presenting complaint;  History of Crohn's disease.  Database and subjective:  Brenda Duncan is 85 year old Caucasian female who has a history of small bowel Crohn's disease with remote surgery who had been on infliximab until February 2018 when infliximab was stopped because of her concerns because she had protracted illness with pneumonia.  She has remained in remission for years..  Her old records were reviewed and was determined she had hyperplastic polyps.  Given her age she has not been interested in colonoscopies. She returns for yearly visit. She states she is doing well.  She has anywhere from 3-6 stools per day.  Stools are semi-formed occasionally formed and sometimes loose.  However she does not have abdominal pain melena or rectal bleeding nocturnal bowel movement or accidents.  She takes Imodium on as-needed basis.  Her appetite is good.  She has lost 9 pounds since her last visit and she is happy that she has been able to do so. She uses Ventolin inhaler on as-needed basis.  She states she has not used it in 2 weeks.  She uses oxygen at night.  Current Medications: Outpatient Encounter Medications as of 07/28/2019  Medication Sig  . arformoterol (BROVANA) 15 MCG/2ML NEBU Take 15 mcg by nebulization daily.  . ergocalciferol (VITAMIN D2) 50000 UNITS capsule Take 50,000 Units by mouth once a week.  Marland Kitchen FLUZONE HIGH-DOSE 0.5 ML SUSY TO BE ADMINISTERED BY A PHARMACIST  . loperamide (IMODIUM) 2 MG capsule Take by mouth as needed for diarrhea or loose stools.  . Multiple Vitamin (MULTIVITAMIN WITH MINERALS) TABS tablet Take 1 tablet by mouth daily.  . OXYGEN Inhale 2 L into the lungs at bedtime.  Marland Kitchen SYNTHROID 150 MCG tablet Take 150 mcg by mouth daily before breakfast.   . VENTOLIN HFA 108 (90 Base) MCG/ACT inhaler Inhale 2 puffs into the lungs as needed.    No facility-administered encounter medications on file as of 07/28/2019.      Objective: Blood pressure (!) 173/87, pulse 72, temperature  98.2 F (36.8 C), temperature source Oral, height 5' 7"  (1.702 m), weight 143 lb 3.2 oz (65 kg). Patient is alert and in no acute distress. She is wearing facial mask. Conjunctiva is pink. Sclera is nonicteric Oropharyngeal mucosa is normal. No neck masses or thyromegaly noted. Cardiac exam with regular rhythm normal S1 and S2. No murmur or gallop noted. Lungs are clear to auscultation. Abdomen abdomen is symmetrical.  She has 2 scars in right midabdomen approaching midline.  Abdomen is soft and nontender with no organomegaly or masses. No LE edema or clubbing noted.  Labs/studies Results:  CRP was 1.1 on 2017-04-05.   Assessment:  #1.  History of small bowel Crohn's disease with prior bowel resection as well as surgery for adhesive disease.  Patient had been on infliximab which was discontinued in February 2018 because of concerns for susceptibility to infection and she had protracted course with pneumonia.  Luckily she has remained in remission.  She has chronic diarrhea which is felt to be due to bowel resection and loss of ileocecal valve.   Plan:  Patient is to have routine blood work by Dr. Shearon Stalls in the next few weeks. Patient will have CRP at that time.  She was given paperwork. She will continue use loperamide OTC 2 mg once or twice daily as needed to decrease stool frequency. Patient will call office if she has abdominal pain rectal bleeding or diarrhea worsens. Office visit in 1 year.

## 2019-07-28 NOTE — Patient Instructions (Signed)
Notify if you have rectal bleeding or stool frequency increases.

## 2019-08-13 ENCOUNTER — Encounter: Payer: Self-pay | Admitting: Internal Medicine

## 2020-07-26 ENCOUNTER — Ambulatory Visit (INDEPENDENT_AMBULATORY_CARE_PROVIDER_SITE_OTHER): Payer: Medicare Other | Admitting: Internal Medicine

## 2020-12-28 ENCOUNTER — Ambulatory Visit (HOSPITAL_COMMUNITY)
Admission: RE | Admit: 2020-12-28 | Discharge: 2020-12-28 | Disposition: A | Discharge status: Home / Self Care | Payer: Medicare Other | Source: Ambulatory Visit | Attending: Pulmonary Disease | Admitting: Pulmonary Disease

## 2020-12-28 ENCOUNTER — Encounter: Payer: Self-pay | Admitting: Pulmonary Disease

## 2020-12-28 ENCOUNTER — Ambulatory Visit (INDEPENDENT_AMBULATORY_CARE_PROVIDER_SITE_OTHER): Payer: Medicare Other | Admitting: Pulmonary Disease

## 2020-12-28 ENCOUNTER — Other Ambulatory Visit: Payer: Self-pay

## 2020-12-28 VITALS — BP 140/70 | HR 84 | Temp 98.3°F | Ht 67.0 in | Wt 146.4 lb

## 2020-12-28 DIAGNOSIS — J479 Bronchiectasis, uncomplicated: Secondary | ICD-10-CM | POA: Diagnosis not present

## 2020-12-28 DIAGNOSIS — J449 Chronic obstructive pulmonary disease, unspecified: Secondary | ICD-10-CM | POA: Insufficient documentation

## 2020-12-28 DIAGNOSIS — J9611 Chronic respiratory failure with hypoxia: Secondary | ICD-10-CM | POA: Diagnosis not present

## 2020-12-28 MED ORDER — BREZTRI AEROSPHERE 160-9-4.8 MCG/ACT IN AERO: 2.0000 | INHALATION_SPRAY | Freq: Two times a day (BID) | RESPIRATORY_TRACT | 0 refills | Status: DC

## 2020-12-28 NOTE — Assessment & Plan Note (Addendum)
She had a recent flareup which was treated with doxycycline and prednisone increase.  This is now resolved.  She is unable to tolerate vest therapy. If she has frequent flareups we can focus on airway clearance with Mucinex and flutter valve and and hypertonic saline nebs if needed.  We will try to obtain her prior imaging studies including CT scan from her prior pulmonologist We discussed signs and symptoms and plan for an exacerbation which would mean antibiotics and prednisone

## 2020-12-28 NOTE — Patient Instructions (Signed)
CXR today Ambuatory saturation  Get records from Dr Marisue Ivan -PFTs & imaging   Decrease prednisone to 68m M/W/F x 2 weeks then stop Sample  Of Breztri -2puffs twice daily - rinse mouth after use  Use albuterol only as needed for wheezing

## 2020-12-28 NOTE — Assessment & Plan Note (Signed)
She is maintained on oxygen for the past few years.  Today oxygen saturation was 94% on arrival and desaturated only to 92% on walking.  As such I do not feel that she needs oxygen all the time.  I have asked her to continue to use oxygen during sleep and try to come off in the daytime.  I am sure she will needed when she has an exacerbation

## 2020-12-28 NOTE — Addendum Note (Signed)
Addended by: Merrilee Seashore on: 12/28/2020 01:18 PM   Modules accepted: Orders

## 2020-12-28 NOTE — Progress Notes (Addendum)
Subjective:    Patient ID: Brenda Duncan, female    DOB: Jun 02, 1934, 87 y.o.   MRN: [id number]  HPI  87 year old ex-smoker who carries a diagnosis of COPD, bronchiectasis and chronic respiratory failure presents to establish care.  Her pulmonologist Dr. Davina Poke in [place] is retiring.  I have obtained and reviewed her prior records.  COPD was diagnosed in 2016, she has been maintained on oxygen since 2018 I note an ABG of 7.4 0/44/66 on room air.  She uses oxygen during sleep and as needed in the daytime up to 15 hours during the day. Bronchiectasis is mentioned in the notes and she is unable to use vest due to chest pain and osteoporosis. I note chest x-ray from 12/2019 which is noted to be clear. Prednisone was started 03/2020 and continued at 5 mg on her last visit in 09/2020.  She reports a flareup for which [facility] and received prednisone increased to 10 mg and an antibiotic with relief of her symptoms She does not seem to be on any kind of maintenance inhaler although previously she has used Symbicort and Breo which was very expensive She does have an albuterol MDI which she uses twice daily  PMH -osteoporosis, status post kyphoplasty T-spine, hypertension , [facility] , Crohn's disease and hypothyroidism She smoked about 40 pack years before she quit 2016  Significant tests/ events reviewed Spirometry 12/2016 ratio 52, FEV1 62%/1.13, FVC 80%, no bronchodilator response  CT chest 01/2018 right middle lobe 5 mm groundglass pulmonary nodule unchanged from 08/2017, changes of emphysema  Past Medical History:  Diagnosis Date  . Crohn's disease ([place])   . Hypothyroid     Past Surgical History:  Procedure Laterality Date  . ABDOMINAL HYSTERECTOMY    . APPENDECTOMY    . BREAST BIOPSY Left   . CATARACT EXTRACTION    . EXPLORATORY LAPAROTOMY     1982  . EXPLORATORY LAPAROTOMY     with ileocecal resection and en BBC small bowel resection in  2008.  . hysterectomy    . Removed part of bladder     88-90  . TONSILLECTOMY     Allergies  Allergen Reactions  . Ipratropium Itching  . Nsaids     Patient states that she cant take with Remicade.    Social History   Socioeconomic History  . Marital status: Married    Spouse name: Not on file  . Number of children: Not on file  . Years of education: Not on file  . Highest education level: Not on file  Occupational History  . Not on file  Tobacco Use  . Smoking status: Former Smoker    Packs/day: 1.00    Years: 40.00    Pack years: 40.00    Types: Cigarettes    Quit date: 11/2006    Years since quitting: 14.1  . Smokeless tobacco: Never Used  . Tobacco comment: quit 7 yrs ago.  Vaping Use  . Vaping Use: Never used  Substance and Sexual Activity  . Alcohol use: No    Alcohol/week: 0.0 standard drinks  . Drug use: No  . Sexual activity: Never  Other Topics Concern  . Not on file  Social History Narrative  . Not on file   Social Determinants of Health   Financial Resource Strain: Not on file  Food Insecurity: Not on file  Transportation Needs: Not on file  Physical Activity: Not on file  Stress: Not on  file  Social Connections: Not on file  Intimate Partner Violence: Not on file   She lives by herself, and [employer], does not mind the 20-minute drive to [place]  History reviewed. No pertinent family history.  Review of Systems Shortness of breath with activity. Cough productive clear sputum tooth problems    Constitutional: negative for anorexia, fevers and sweats  Eyes: negative for irritation, redness and visual disturbance  Ears, nose, mouth, throat, and face: negative for earaches, epistaxis, nasal congestion and sore throat  Respiratory: negative for  wheezing  Cardiovascular: negative for chest pain, dyspnea, lower extremity edema, orthopnea, palpitations and syncope  Gastrointestinal: negative for abdominal pain, constipation, diarrhea,  melena, nausea and vomiting  Genitourinary:negative for dysuria, frequency and hematuria  Hematologic/lymphatic: negative for bleeding, easy bruising and lymphadenopathy  Musculoskeletal:negative for arthralgias, muscle weakness and stiff joints  Neurological: negative for coordination problems, gait problems, headaches and weakness  Endocrine: negative for diabetic symptoms including polydipsia, polyuria and weight loss     Objective:   Physical Exam  Gen. Pleasant, well-nourished, elderly woman, in no distress, normal affect ENT - no pallor,icterus, no post nasal drip Neck: No JVD, no thyromegaly, no carotid bruits Lungs: no use of accessory muscles, no dullness to percussion, clear without rales or rhonchi  Cardiovascular: Rhythm regular, heart sounds  normal, no murmurs or gallops, no peripheral edema Abdomen: soft and non-tender, no hepatosplenomegaly, BS normal. Musculoskeletal: No deformities, no cyanosis or clubbing Neuro:  alert, non focal       Assessment & Plan:

## 2020-12-28 NOTE — Assessment & Plan Note (Signed)
She carries a diagnosis of COPD.  We will obtain prior PFTs to assess.  At this time she is maintained on 5 mg of prednisone I think she already has osteoporosis and would like to wean her off prednisone.  She will come down to 5 mg every other day and stop in 2 weeks.  We will place her on a maintenance MDI such as Breztri, sample provided today.  If this is too expensive or if she has side effects then she can go on Symbicort. I have asked her to decrease use of albuterol to only as needed

## 2020-12-30 NOTE — Progress Notes (Signed)
Called and went over xray results per Dr Elsworth Soho with patient. All questions answered and patient expressed full understanding. Nothing further needed at this time.

## 2021-01-19 ENCOUNTER — Telehealth: Payer: Self-pay | Admitting: Pulmonary Disease

## 2021-01-19 MED ORDER — BREZTRI AEROSPHERE 160-9-4.8 MCG/ACT IN AERO: 2.0000 | INHALATION_SPRAY | Freq: Two times a day (BID) | RESPIRATORY_TRACT | 11 refills | Status: AC

## 2021-01-19 NOTE — Telephone Encounter (Signed)
Spoke with the pt  Brenda Duncan is working well for her  Rx sent to her preferred pharm  Nothing further needed

## 2021-01-20 ENCOUNTER — Telehealth: Payer: Self-pay | Admitting: Pulmonary Disease

## 2021-01-20 NOTE — Telephone Encounter (Signed)
Tried calling pt and there was no answer, and no VM ever picked up. WCB.

## 2021-01-20 NOTE — Telephone Encounter (Signed)
Please check on alternatives for her for Breztri-prefer Trelegy but we can also trial Anoro or Stiolto if these are better covered  She was maintained on chronic prednisone for COPD, since I am attempting to take her off prednisone she needs a good maintenance inhaler to keep her from getting flareups

## 2021-01-20 NOTE — Telephone Encounter (Signed)
Called spoke with patient.  She states she is doing very well and doesn't know why she is on the inhaler.  She is not using the oxygen during the day.  She is wondering if she needs to come off the [facility] and just use her albuterol until next appointment on 02/20/21.  I explained to her we can put samples and the patient assistance paperwork at the front desk for her to pick up.   Dr. Elsworth Soho please advise on patient and Breztri vs albuterol before follow up in April

## 2021-01-23 NOTE — Telephone Encounter (Signed)
Spoke with the pt and notified of response per Dr Elsworth Soho  She states that she has information on what inhalers are covered written down at home  She will call us later with this information so we can decide on what to send  Will await her call back

## 2021-01-24 MED ORDER — BREZTRI AEROSPHERE 160-9-4.8 MCG/ACT IN AERO: 2.0000 | INHALATION_SPRAY | Freq: Two times a day (BID) | RESPIRATORY_TRACT | 0 refills | Status: DC

## 2021-01-24 NOTE — Telephone Encounter (Signed)
Patient came to [facility] office on 01/23/2021 and picked up Breztri samples that writer was asked to put aside for patient on Friday 01/20/2021 by Ander Purpura in triage stating patient would pick up samples on Monday 01/23/2021. [place] paperwork also given to patient. Patient expressed full understanding to fill out paperwork and return. Nothing further needed at this time.

## 2021-02-20 ENCOUNTER — Ambulatory Visit (INDEPENDENT_AMBULATORY_CARE_PROVIDER_SITE_OTHER): Payer: Medicare Other | Admitting: Pulmonary Disease

## 2021-02-20 ENCOUNTER — Encounter: Payer: Self-pay | Admitting: Pulmonary Disease

## 2021-02-20 ENCOUNTER — Other Ambulatory Visit: Payer: Self-pay

## 2021-02-20 DIAGNOSIS — J9611 Chronic respiratory failure with hypoxia: Secondary | ICD-10-CM | POA: Diagnosis not present

## 2021-02-20 DIAGNOSIS — J449 Chronic obstructive pulmonary disease, unspecified: Secondary | ICD-10-CM | POA: Diagnosis not present

## 2021-02-20 NOTE — Assessment & Plan Note (Signed)
She is off oxygen in the daytime. We will check nocturnal oximetry on room air and decide whether she needs oxygen to be continued during sleep

## 2021-02-20 NOTE — Addendum Note (Signed)
Addended by: Merrilee Seashore on: 02/20/2021 11:22 AM   Modules accepted: Orders

## 2021-02-20 NOTE — Assessment & Plan Note (Signed)
I am pleased that she has been able to come off prednisone.  She can stay on Breztri if this is affordable otherwise we can have her go back to Symbicort 2 puffs twice daily. She will continue to use albuterol on an as-needed basis for rescue

## 2021-02-20 NOTE — Progress Notes (Signed)
   Subjective:    Patient ID: Brenda Duncan, female    DOB: 19-Oct-1934, 87 y.o.   MRN: [id number]  HPI  87 year old ex-smoker for FU of  COPD, bronchiectasis and chronic respiratory failure  She established with [place] 12/2020 after Dr. Vivia Ewing retirement  [place] -osteoporosis, status post kyphoplasty T-spine, hypertension , DME [facility] , Crohn's disease and hypothyroidism She smoked about 40 pack years before she quit 2016  Last OV - given breztri Oxygen levels were noted to be improved and I encouraged her to stay off oxygen.  We took her off prednisone.  Judithann Sauger is certainly helped her for her but she finds this to be too expensive. She has used Symbicort and Advair in the past and wonders if she can go back to this. She is able to stay off oxygen for longer periods  Significant tests/ events reviewed Spirometry 12/2016 ratio 52, FEV1 62%/1.13, FVC 80%, no bronchodilator response  CT chest 01/2018 right middle lobe 5 mm groundglass pulmonary nodule unchanged from 08/2017, changes of emphysema  Review of Systems neg for any significant sore throat, dysphagia, itching, sneezing, nasal congestion or excess/ purulent secretions, fever, chills, sweats, unintended wt loss, pleuritic or exertional cp, hempoptysis, orthopnea pnd or change in chronic leg swelling. Also denies presyncope, palpitations, heartburn, abdominal pain, nausea, vomiting, diarrhea or change in bowel or urinary habits, dysuria,hematuria, rash, arthralgias, visual complaints, headache, numbness weakness or ataxia.     Objective:   Physical Exam  Gen. Pleasant, well-nourished, elderly, in no distress ENT - no thrush, no pallor/icterus,no post nasal drip Neck: No JVD, no thyromegaly, no carotid bruits Lungs: no use of accessory muscles, no dullness to percussion, clear without rales or rhonchi  Cardiovascular: Rhythm regular, heart sounds  normal, no murmurs or gallops, no peripheral edema Musculoskeletal: No  deformities, no cyanosis or clubbing        Assessment & Plan:

## 2021-02-20 NOTE — Patient Instructions (Signed)
Noct oximetry on room air  Try to stay on Brextri , refills sent - if too expensive, OK to use  symbicort instead

## 2021-03-07 ENCOUNTER — Telehealth: Payer: Self-pay | Admitting: Pulmonary Disease

## 2021-03-07 NOTE — Telephone Encounter (Signed)
Please obtain results/nocturnal oximetry on room air

## 2021-03-07 NOTE — Telephone Encounter (Signed)
I called and spoke to patient about Dr. Constance Goltz and patient stated she has the [facility] on [facility]. I checked Dr. Elsworth Soho box and we have the results. I notified patient that Dr. Elsworth Soho will be back in office next Tuesday and will result it then and we will call back. Patient verbalized understanding. Will route to Dr. Elsworth Soho as Juluis Rainier.  Dr.Alva, just sending this as a FYI for when you are back in office. ONO results are in box. Thanks!

## 2021-03-07 NOTE — Telephone Encounter (Signed)
Called spoke with patient. She believes she can come off her oxygen at night. She states she has tried it a few nights and her levels are 90-93% in the morning and all day.   Sending to Dr. Elsworth Soho for recommendations

## 2021-03-08 NOTE — Telephone Encounter (Signed)
ONO/RA showed sig desaturation < 88% for 2 h out of 7h of sleep Suggest , she stay on oxygen during sleep

## 2021-03-09 NOTE — Telephone Encounter (Signed)
Called and went over ONO result per Dr Elsworth Soho with patient. All questions answered and patient expressed full understanding of result and Dr Bari Mantis recommendation to stay on oxygen during sleep. Nothing further needed at this time.

## 2021-05-25 ENCOUNTER — Other Ambulatory Visit: Payer: Self-pay

## 2021-05-25 ENCOUNTER — Ambulatory Visit (INDEPENDENT_AMBULATORY_CARE_PROVIDER_SITE_OTHER): Payer: Medicare Other | Admitting: Internal Medicine

## 2021-05-25 ENCOUNTER — Encounter (INDEPENDENT_AMBULATORY_CARE_PROVIDER_SITE_OTHER): Payer: Self-pay | Admitting: Internal Medicine

## 2021-05-25 VITALS — BP 146/81 | HR 86 | Temp 98.5°F | Ht 67.0 in | Wt 143.3 lb

## 2021-05-25 DIAGNOSIS — R109 Unspecified abdominal pain: Secondary | ICD-10-CM | POA: Diagnosis not present

## 2021-05-25 DIAGNOSIS — K5000 Crohn's disease of small intestine without complications: Secondary | ICD-10-CM | POA: Diagnosis not present

## 2021-05-25 NOTE — H&P (View-Only) (Signed)
Presenting complaint;  Left sided abdominal pain.  Database and subjective:  Patient is 87 year old Caucasian female who has several year history of small bowel Crohn's disease with remote bowel resection who had been on infliximab which was discontinued in February 2018 and she has remained in remission.  She was last seen in September 2020 and was doing well.  She did not return for yearly visit last year. Patient states she was in usual state of health until 05/06/2021 when she developed sharp abdominal pain to the left of midline at the level of umbilicus.  She was seen in the emergency room and discharged.  Her pain continued.  2 days later she was seen by primary care physician and she was hospitalized.  She says lab studies unremarkable.  She had abdominal pelvic CT which revealed focal thickening to anterior gastric wall but no abnormality to small bowel.  She was discharged and advised to follow-up with [place].  Patient states she was fine until 05/05/2021.  She worked in her yard most of the day.  She says the pain only occurred if she would stand from sitting position or if she would sit from supine position.  She did not have any pain if she was standing or lying flat.  She did not experience nausea vomiting melena or rectal bleeding.  She also did not experience fever chills hematuria or diarrhea.  No prior history of peptic ulcer disease.  She does not take any NSAIDs.  She says she has good appetite and and not losing any weight. She is not sure that she needs to undergo esophagogastroduodenoscopy.   Current Medications: Outpatient Encounter Medications as of 05/25/2021  Medication Sig   Albuterol Sulfate 2.5 MG/0.5ML NEBU Inhale into the lungs 2 (two) times daily as needed.   amLODipine (NORVASC) 2.5 MG tablet Take 2.5 mg by mouth daily.   Budeson-Glycopyrrol-Formoterol (BREZTRI AEROSPHERE) 160-9-4.8 MCG/ACT AERO Inhale 2 puffs into the lungs 2 (two) times daily.   ergocalciferol (VITAMIN  D2) 50000 UNITS capsule Take 50,000 Units by mouth once a week.   loperamide (IMODIUM) 2 MG capsule Take by mouth as needed for diarrhea or loose stools.   losartan (COZAAR) 50 MG tablet Take 50 mg by mouth daily.   mirtazapine (REMERON) 15 MG tablet Take 15 mg by mouth at bedtime. Takes 1/2 tab at beditme   Multiple Vitamin (MULTIVITAMIN WITH MINERALS) TABS tablet Take 1 tablet by mouth daily.   OXYGEN Inhale 2 L into the lungs at bedtime.   SYNTHROID 150 MCG tablet Take 150 mcg by mouth daily before breakfast.    VENTOLIN HFA 108 (90 Base) MCG/ACT inhaler Inhale 2 puffs into the lungs as needed.    No facility-administered encounter medications on file as of 05/25/2021.   Past Medical History:  Diagnosis Date   Crohn's disease ([place]) in remission without any therapy    Hypothyroid      Osteoporosis.  Status post T8 vertebroplasty     COPD and bronchiectasis.       Chronic respiratory failure.  He uses nasal O2 at night.     History of psoriasis unrelated to infliximab use.  Presently in remission.   Past Surgical History:  Procedure Laterality Date   ABDOMINAL HYSTERECTOMY     APPENDECTOMY     BREAST BIOPSY Left    CATARACT EXTRACTION     EXPLORATORY LAPAROTOMY     1982   EXPLORATORY LAPAROTOMY     with ileocecal resection and en BBC small  bowel resection in 2008.   hysterectomy     Removed part of bladder     88-90   TONSILLECTOMY        Objective: Blood pressure (!) 146/81, pulse 86, temperature 98.5 F (36.9 C), temperature source Oral, height 5' 7"  (1.702 m), weight 143 lb 4.8 oz (65 kg). Patient is alert and in no acute distress. She is wearing a mask. Conjunctiva is pink. Sclera is nonicteric Oropharyngeal mucosa is normal. No neck masses or thyromegaly noted. Cardiac exam with regular rhythm normal S1 and S2. No murmur or gallop noted. Lungs are clear to auscultation. Abdomen is symmetrical.  She has lower midline and horizontal scar in right lower quadrant of  her abdomen.  Bowel sounds are normal.  On palpation abdomen is soft and nontender with organomegaly or masses. No LE edema or clubbing noted.  Labs/studies Results:  Lab data from 05/08/2021 WBC 10.64 H&H 11.1, platelet count 269\BUN 14 and creatinine 0.65, glucose 99, sodium 136 and potassium 4.2.\Rest of the lab data is not available.  Abdominal pelvic CT on 05/08/2021 revealed wall thickening to anterior gastric wall.  No abnormality to small or large bowel.  No adenopathy.  Assessment:  #1.  Left-sided abdominal pain has completely resolved.  Her history suggests muscular pain probably resulting from a strain that she had after working in her yard the day before.  Nothing to suggest peptic ulcer disease.  CT reveals gastric wall thickening.  Since patient is asymptomatic I do not feel that she needs diagnostic endoscopy.  However I would like to request CT films for review before making final determination.  #2.  History of small bowel Crohn's disease with remote bowel resection.  She has been in remission for several years.  She was on infliximab until February 2018.  CT from 05/06/2021 does not reveal any abnormality to small or large bowel.  Therefore she continues to be in remission as far as Crohn's disease is concerned.   Plan:  Will review recent abdominal pelvic CT and contact patient with recommendations as to whether she should undergo EGD or not. Patient will call office if abdominal pain recurs. Office visit on as-needed basis  Addendum  I reviewed patient's CT with Dr. Marylynn Pearson. This study shows gastric wall thickening and also haziness to 2 fat/soft tissue anterior to stomach.  No abnormality to abdominal wall.  No wall thickening to small bowel.  Evidence of prior bowel surgery. Gallstones. Findings reviewed with patient. Will proceed with diagnostic esophagogastroduodenoscopy next week. Gallstones felt to be a symptomatic.

## 2021-05-25 NOTE — Patient Instructions (Signed)
Will request copy of recent blood work and CT from for review. Will call you with recommendations.

## 2021-05-25 NOTE — Progress Notes (Addendum)
Presenting complaint;  Left sided abdominal pain.  Database and subjective:  Patient is 87 year old Caucasian female who has several year history of small bowel Crohn's disease with remote bowel resection who had been on infliximab which was discontinued in February 2018 and she has remained in remission.  She was last seen in September 2020 and was doing well.  She did not return for yearly visit last year. Patient states she was in usual state of health until 05/06/2021 when she developed sharp abdominal pain to the left of midline at the level of umbilicus.  She was seen in the emergency room and discharged.  Her pain continued.  2 days later she was seen by primary care physician and she was hospitalized.  She says lab studies unremarkable.  She had abdominal pelvic CT which revealed focal thickening to anterior gastric wall but no abnormality to small bowel.  She was discharged and advised to follow-up with [place].  Patient states she was fine until 05/05/2021.  She worked in her yard most of the day.  She says the pain only occurred if she would stand from sitting position or if she would sit from supine position.  She did not have any pain if she was standing or lying flat.  She did not experience nausea vomiting melena or rectal bleeding.  She also did not experience fever chills hematuria or diarrhea.  No prior history of peptic ulcer disease.  She does not take any NSAIDs.  She says she has good appetite and and not losing any weight. She is not sure that she needs to undergo esophagogastroduodenoscopy.   Current Medications: Outpatient Encounter Medications as of 05/25/2021  Medication Sig   Albuterol Sulfate 2.5 MG/0.5ML NEBU Inhale into the lungs 2 (two) times daily as needed.   amLODipine (NORVASC) 2.5 MG tablet Take 2.5 mg by mouth daily.   Budeson-Glycopyrrol-Formoterol (BREZTRI AEROSPHERE) 160-9-4.8 MCG/ACT AERO Inhale 2 puffs into the lungs 2 (two) times daily.   ergocalciferol (VITAMIN  D2) 50000 UNITS capsule Take 50,000 Units by mouth once a week.   loperamide (IMODIUM) 2 MG capsule Take by mouth as needed for diarrhea or loose stools.   losartan (COZAAR) 50 MG tablet Take 50 mg by mouth daily.   mirtazapine (REMERON) 15 MG tablet Take 15 mg by mouth at bedtime. Takes 1/2 tab at beditme   Multiple Vitamin (MULTIVITAMIN WITH MINERALS) TABS tablet Take 1 tablet by mouth daily.   OXYGEN Inhale 2 L into the lungs at bedtime.   SYNTHROID 150 MCG tablet Take 150 mcg by mouth daily before breakfast.    VENTOLIN HFA 108 (90 Base) MCG/ACT inhaler Inhale 2 puffs into the lungs as needed.    No facility-administered encounter medications on file as of 05/25/2021.   Past Medical History:  Diagnosis Date   Crohn's disease ([place]) in remission without any therapy    Hypothyroid      Osteoporosis.  Status post T8 vertebroplasty     COPD and bronchiectasis.       Chronic respiratory failure.  He uses nasal O2 at night.     History of psoriasis unrelated to infliximab use.  Presently in remission.   Past Surgical History:  Procedure Laterality Date   ABDOMINAL HYSTERECTOMY     APPENDECTOMY     BREAST BIOPSY Left    CATARACT EXTRACTION     EXPLORATORY LAPAROTOMY     1982   EXPLORATORY LAPAROTOMY     with ileocecal resection and en BBC small  bowel resection in 2008.   hysterectomy     Removed part of bladder     88-90   TONSILLECTOMY        Objective: Blood pressure (!) 146/81, pulse 86, temperature 98.5 F (36.9 C), temperature source Oral, height 5' 7"  (1.702 m), weight 143 lb 4.8 oz (65 kg). Patient is alert and in no acute distress. She is wearing a mask. Conjunctiva is pink. Sclera is nonicteric Oropharyngeal mucosa is normal. No neck masses or thyromegaly noted. Cardiac exam with regular rhythm normal S1 and S2. No murmur or gallop noted. Lungs are clear to auscultation. Abdomen is symmetrical.  She has lower midline and horizontal scar in right lower quadrant of  her abdomen.  Bowel sounds are normal.  On palpation abdomen is soft and nontender with organomegaly or masses. No LE edema or clubbing noted.  Labs/studies Results:  Lab data from 05/08/2021 WBC 10.64 H&H 11.1, platelet count 269\BUN 14 and creatinine 0.65, glucose 99, sodium 136 and potassium 4.2.\Rest of the lab data is not available.  Abdominal pelvic CT on 05/08/2021 revealed wall thickening to anterior gastric wall.  No abnormality to small or large bowel.  No adenopathy.  Assessment:  #1.  Left-sided abdominal pain has completely resolved.  Her history suggests muscular pain probably resulting from a strain that she had after working in her yard the day before.  Nothing to suggest peptic ulcer disease.  CT reveals gastric wall thickening.  Since patient is asymptomatic I do not feel that she needs diagnostic endoscopy.  However I would like to request CT films for review before making final determination.  #2.  History of small bowel Crohn's disease with remote bowel resection.  She has been in remission for several years.  She was on infliximab until February 2018.  CT from 05/06/2021 does not reveal any abnormality to small or large bowel.  Therefore she continues to be in remission as far as Crohn's disease is concerned.   Plan:  Will review recent abdominal pelvic CT and contact patient with recommendations as to whether she should undergo EGD or not. Patient will call office if abdominal pain recurs. Office visit on as-needed basis  Addendum  I reviewed patient's CT with Dr. Marylynn Pearson. This study shows gastric wall thickening and also haziness to 2 fat/soft tissue anterior to stomach.  No abnormality to abdominal wall.  No wall thickening to small bowel.  Evidence of prior bowel surgery. Gallstones. Findings reviewed with patient. Will proceed with diagnostic esophagogastroduodenoscopy next week. Gallstones felt to be a symptomatic.

## 2021-05-26 ENCOUNTER — Encounter (INDEPENDENT_AMBULATORY_CARE_PROVIDER_SITE_OTHER): Payer: Self-pay | Admitting: Internal Medicine

## 2021-06-05 ENCOUNTER — Telehealth (INDEPENDENT_AMBULATORY_CARE_PROVIDER_SITE_OTHER): Payer: Self-pay

## 2021-06-05 NOTE — Telephone Encounter (Signed)
Per Dr.Rehman please schedule the patient for EGD with conscious sedation this Thursday or Next Thursday, if eith of these do not work schedule it for Friday 06/16/2021.

## 2021-06-06 ENCOUNTER — Other Ambulatory Visit (INDEPENDENT_AMBULATORY_CARE_PROVIDER_SITE_OTHER): Payer: Self-pay

## 2021-06-06 DIAGNOSIS — K5000 Crohn's disease of small intestine without complications: Secondary | ICD-10-CM

## 2021-06-06 DIAGNOSIS — R109 Unspecified abdominal pain: Secondary | ICD-10-CM

## 2021-06-08 ENCOUNTER — Other Ambulatory Visit (HOSPITAL_COMMUNITY): Payer: Self-pay | Admitting: Internal Medicine

## 2021-06-08 ENCOUNTER — Ambulatory Visit (HOSPITAL_COMMUNITY)
Admission: RE | Admit: 2021-06-08 | Discharge: 2021-06-08 | Disposition: A | Discharge status: Home / Self Care | Payer: Medicare Other | Attending: Internal Medicine | Admitting: Internal Medicine

## 2021-06-08 ENCOUNTER — Encounter (HOSPITAL_COMMUNITY)
Admission: RE | Disposition: A | Discharge status: Home / Self Care | Payer: Self-pay | Source: Home / Self Care | Attending: Internal Medicine

## 2021-06-08 ENCOUNTER — Other Ambulatory Visit: Payer: Self-pay

## 2021-06-08 ENCOUNTER — Encounter (HOSPITAL_COMMUNITY): Payer: Self-pay | Admitting: Internal Medicine

## 2021-06-08 ENCOUNTER — Ambulatory Visit
Admission: RE | Admit: 2021-06-08 | Discharge: 2021-06-08 | Disposition: A | Discharge status: Home / Self Care | Payer: Self-pay | Source: Ambulatory Visit | Attending: Internal Medicine | Admitting: Internal Medicine

## 2021-06-08 DIAGNOSIS — Z79899 Other long term (current) drug therapy: Secondary | ICD-10-CM | POA: Diagnosis not present

## 2021-06-08 DIAGNOSIS — K5000 Crohn's disease of small intestine without complications: Secondary | ICD-10-CM | POA: Insufficient documentation

## 2021-06-08 DIAGNOSIS — Z7951 Long term (current) use of inhaled steroids: Secondary | ICD-10-CM | POA: Diagnosis not present

## 2021-06-08 DIAGNOSIS — R109 Unspecified abdominal pain: Secondary | ICD-10-CM

## 2021-06-08 DIAGNOSIS — R933 Abnormal findings on diagnostic imaging of other parts of digestive tract: Secondary | ICD-10-CM | POA: Diagnosis not present

## 2021-06-08 DIAGNOSIS — K3189 Other diseases of stomach and duodenum: Secondary | ICD-10-CM | POA: Diagnosis not present

## 2021-06-08 DIAGNOSIS — K766 Portal hypertension: Secondary | ICD-10-CM | POA: Diagnosis not present

## 2021-06-08 DIAGNOSIS — Z79890 Hormone replacement therapy (postmenopausal): Secondary | ICD-10-CM | POA: Insufficient documentation

## 2021-06-08 DIAGNOSIS — K2289 Other specified disease of esophagus: Secondary | ICD-10-CM | POA: Insufficient documentation

## 2021-06-08 HISTORY — PX: BIOPSY: SHX5522

## 2021-06-08 HISTORY — PX: ESOPHAGOGASTRODUODENOSCOPY: SHX5428

## 2021-06-08 SURGERY — EGD (ESOPHAGOGASTRODUODENOSCOPY)
Anesthesia: Moderate Sedation

## 2021-06-08 MED ORDER — LIDOCAINE VISCOUS HCL 2 % MT SOLN
OROMUCOSAL | Status: AC
  Filled 2021-06-08: qty 15

## 2021-06-08 MED ORDER — MEPERIDINE HCL 50 MG/ML IJ SOLN
INTRAMUSCULAR | Status: AC
  Filled 2021-06-08: qty 1

## 2021-06-08 MED ORDER — MIDAZOLAM HCL 5 MG/5ML IJ SOLN
INTRAMUSCULAR | Status: DC | PRN
  Administered 2021-06-08 (×3): 1 mg via INTRAVENOUS

## 2021-06-08 MED ORDER — MEPERIDINE HCL 50 MG/ML IJ SOLN
INTRAMUSCULAR | Status: DC | PRN
  Administered 2021-06-08 (×2): 25 mg via INTRAVENOUS

## 2021-06-08 MED ORDER — LIDOCAINE VISCOUS HCL 2 % MT SOLN
OROMUCOSAL | Status: DC | PRN
  Administered 2021-06-08: 5 mL via OROMUCOSAL

## 2021-06-08 MED ORDER — STERILE WATER FOR IRRIGATION IR SOLN
Status: DC | PRN
  Administered 2021-06-08: 100 mL

## 2021-06-08 MED ORDER — SODIUM CHLORIDE 0.9 % IV SOLN: INTRAVENOUS | Status: DC

## 2021-06-08 MED ORDER — MIDAZOLAM HCL 5 MG/5ML IJ SOLN
INTRAMUSCULAR | Status: AC
  Filled 2021-06-08: qty 10

## 2021-06-08 NOTE — Interval H&P Note (Signed)
Patient has no complaints. I reviewed abdominal pelvic CT from 05/06/2021 with Dr. Marylynn Pearson.  Gastric wall is thickened and area anterior to the stomach is very hazy.  No abnormality noted to abdominal wall. Please note CT images have been transferred into epic for future reference. Patient is here for here for diagnostic esophagogastroduodenoscopy.  She has not experience abdominal pain since she was seen in the office.  History and Physical Interval Note:  06/08/2021 8:25 AM  Brenda Duncan  has presented today for surgery, with the diagnosis of Crohns Unspecified Abdominal Pain.  The various methods of treatment have been discussed with the patient and family. After consideration of risks, benefits and other options for treatment, the patient has consented to  Procedure(s) with comments: ESOPHAGOGASTRODUODENOSCOPY (EGD) (N/A) - 8:20 as a surgical intervention.  The patient's history has been reviewed, patient examined, no change in status, stable for surgery.  I have reviewed the patient's chart and labs.  Questions were answered to the patient's satisfaction.     [employer]

## 2021-06-08 NOTE — Op Note (Signed)
[facility] Patient Name: Brenda Duncan Procedure Date: 06/08/2021 8:02 AM MRN: [id number] Date of Birth: [birth year] Attending MD: Hildred Laser , MD CSN: [id number] Age: 87 Admit Type: Outpatient Procedure:                Upper GI endoscopy Indications:              Abnormal CT of the GI tract; thickened gastric wall. Providers:                Hildred Laser, MD, Caprice Kluver, Raphael Gibney,                            Technician Referring MD:             Kennieth Rad, MD Medicines:                Lidocaine spray, Meperidine 50 mg IV, Midazolam 3                            mg IV Complications:            No immediate complications. Estimated Blood Loss:     Estimated blood loss was minimal. Procedure:                Pre-Anesthesia Assessment:                           - Prior to the procedure, a History and Physical                            was performed, and patient medications and                            allergies were reviewed. The patient's tolerance of                            previous anesthesia was also reviewed. The risks                            and benefits of the procedure and the sedation                            options and risks were discussed with the patient.                            All questions were answered, and informed consent                            was obtained. Prior Anticoagulants: The patient has                            taken no previous anticoagulant or antiplatelet                            agents. ASA Grade Assessment: II - A patient with  mild systemic disease. After reviewing the risks                            and benefits, the patient was deemed in                            satisfactory condition to undergo the procedure.                           After obtaining informed consent, the endoscope was                            passed under direct vision. Throughout the                             procedure, the patient's blood pressure, pulse, and                            oxygen saturations were monitored continuously. The                            GIF-H190 ([id number]) scope was introduced through the                            mouth, and advanced to the second part of duodenum.                            The upper GI endoscopy was accomplished without                            difficulty. The patient tolerated the procedure                            well. Scope In: 8:43:34 AM Scope Out: 8:52:12 AM Total Procedure Duration: 0 hours 8 minutes 38 seconds  Findings:      The hypopharynx was normal.      The examined esophagus was normal.      The Z-line was irregular and was found 38 cm from the incisors.      Mild portal hypertensive gastropathy was found in the gastric fundus and       in the gastric body. Biopsies were taken from gastric body with a cold       forceps for histology.      The exam of the stomach was otherwise normal.      The duodenal bulb and second portion of the duodenum were normal. Impression:               - Normal hypopharynx.                           - Normal esophagus.                           - Z-line irregular, 38 cm from the incisors.                           -  Portal hypertensive gastropathy. Biopsied.                           - Normal duodenal bulb and second portion of the                            duodenum. Moderate Sedation:      Moderate (conscious) sedation was administered by the endoscopy nurse       and supervised by the endoscopist. The following parameters were       monitored: oxygen saturation, heart rate, blood pressure, CO2       capnography and response to care. Total physician intraservice time was       14 minutes. Recommendation:           - Patient has a contact number available for                            emergencies. The signs and symptoms of potential                            delayed complications were discussed  with the                            patient. Return to normal activities tomorrow.                            Written discharge instructions were provided to the                            patient.                           - Resume previous diet today.                           - Continue present medications.                           - No aspirin, ibuprofen, naproxen, or other                            non-steroidal anti-inflammatory drugs for 1 day.                           - Await pathology results. Procedure Code(s):        --- Professional ---                           [phone], Esophagogastroduodenoscopy, flexible,                            transoral; with biopsy, single or multiple                           G0500, Moderate sedation services provided by the  same physician or other qualified health care                            professional performing a gastrointestinal                            endoscopic service that sedation supports,                            requiring the presence of an independent trained                            observer to assist in the monitoring of the                            patient's level of consciousness and physiological                            status; initial 15 minutes of intra-service time;                            patient age 79 years or older (additional time may                            be reported with [phone], as appropriate) Diagnosis Code(s):        --- Professional ---                           K22.8, Other specified diseases of esophagus                           K76.6, Portal hypertension                           K31.89, Other diseases of stomach and duodenum                           R93.3, Abnormal findings on diagnostic imaging of                            other parts of digestive tract CPT copyright 2019 American [employer] Association. All rights reserved. The codes documented in this report  are preliminary and upon coder review may  be revised to meet current compliance requirements. Hildred Laser, MD Hildred Laser, MD 06/08/2021 8:59:13 AM This report has been signed electronically. Number of Addenda: 0

## 2021-06-08 NOTE — Discharge Instructions (Signed)
No aspirin or NSAIDs for 24 hours. Resume usual medications and diet as before. No driving for 24 hours. Physician will call with biopsy results.

## 2021-06-09 LAB — SURGICAL PATHOLOGY

## 2021-06-12 ENCOUNTER — Other Ambulatory Visit (INDEPENDENT_AMBULATORY_CARE_PROVIDER_SITE_OTHER): Payer: Self-pay | Admitting: Internal Medicine

## 2021-06-12 ENCOUNTER — Telehealth: Payer: Self-pay | Admitting: Pulmonary Disease

## 2021-06-12 ENCOUNTER — Encounter (HOSPITAL_COMMUNITY): Payer: Self-pay | Admitting: Internal Medicine

## 2021-06-12 MED ORDER — PYLERA 140-125-125 MG PO CAPS: 3.0000 | ORAL_CAPSULE | Freq: Three times a day (TID) | ORAL | 0 refills | Status: DC

## 2021-06-12 MED ORDER — ALBUTEROL SULFATE (2.5 MG/3ML) 0.083% IN NEBU: 2.5000 mg | INHALATION_SOLUTION | Freq: Two times a day (BID) | RESPIRATORY_TRACT | 6 refills | Status: DC

## 2021-06-12 MED ORDER — PANTOPRAZOLE SODIUM 40 MG PO TBEC: 40.0000 mg | DELAYED_RELEASE_TABLET | Freq: Every day | ORAL | 0 refills | Status: DC

## 2021-06-12 NOTE — Telephone Encounter (Signed)
Called and spoke with patient who states that she needs a refill on her albuterol neb medication. She verified pharmacy. Refill has been sent in. She stated that she was going to keep taking th Breztri as instructed until her appointment in September. Advised her if she changed her mind to call and let us know. She expressed understanding. Nothing further needed at this time.

## 2021-06-13 ENCOUNTER — Telehealth (INDEPENDENT_AMBULATORY_CARE_PROVIDER_SITE_OTHER): Payer: Self-pay | Admitting: *Deleted

## 2021-06-13 NOTE — Telephone Encounter (Signed)
Med prescribed yesterday. Pylera is $575 and pt is requesting a cheaper alternative.   [facility] in [place].  Pt's number [phone]

## 2021-06-14 ENCOUNTER — Other Ambulatory Visit (INDEPENDENT_AMBULATORY_CARE_PROVIDER_SITE_OTHER): Payer: Self-pay | Admitting: *Deleted

## 2021-06-14 MED ORDER — TETRACYCLINE HCL 500 MG PO CAPS: 500.0000 mg | ORAL_CAPSULE | Freq: Three times a day (TID) | ORAL | 0 refills | Status: DC

## 2021-06-14 MED ORDER — METRONIDAZOLE 500 MG PO TABS: 500.0000 mg | ORAL_TABLET | Freq: Three times a day (TID) | ORAL | 0 refills | Status: DC

## 2021-06-14 NOTE — Telephone Encounter (Signed)
Per Dr. Laural Golden. Change to pepto bis one tablespoon tid for 10 days, tetracycline 500 one tid for 10 days, flagyl 500 tid for 10 days. [facility] and pharmacist states tetracycline is no longer available. States its not on the market and states other providers have been using doxy in the place of it. Called pt to make her aware we are working on this and I will discuss with Dr. Laural Golden when he returns to office. Pt also aware that dr Laural Golden wanted her to start protonix 2 days prior to taking antibiotic and she states she will start it today.

## 2021-06-14 NOTE — Telephone Encounter (Signed)
[facility] called back and said they did have the tetracycline back in stock and will fill med for pt. Pt called and notified.

## 2021-06-15 ENCOUNTER — Encounter (INDEPENDENT_AMBULATORY_CARE_PROVIDER_SITE_OTHER): Payer: Self-pay

## 2021-06-28 ENCOUNTER — Telehealth: Payer: Self-pay | Admitting: Pulmonary Disease

## 2021-06-28 NOTE — Telephone Encounter (Signed)
Called and spoke with patient to let her know that Dr. Elsworth Soho said for her to come off the [facility]. When I mentioned her going back on the Symbicort she states that she was never on Symbicort the only inhaler that she has been on or used other than Judithann Sauger is her rescue inhaler which she hardly uses. Advised her to stop Breztri and continue with rescue inhaler as needed and if her breathing changed or got worse to call and let us know and we could send in RX for Symbicort. She expressed understanding. Nothing further needed at this time.

## 2021-06-28 NOTE — Telephone Encounter (Signed)
Called and spoke with patient who states that she is calling about the Brenda Duncan she was given at last OV. States she is having issues with her legs. States "they feel like rubber" Also states that she also has sore throat and hoarseness. Would like to stop it and see how she does and if symptoms resolve and if she feels worse then she will start it back.   Dr. Elsworth Soho please advise

## 2021-06-28 NOTE — Telephone Encounter (Signed)
ATC patient, LMTCB 

## 2021-06-28 NOTE — Telephone Encounter (Signed)
[phone] pt calling back

## 2021-08-09 ENCOUNTER — Other Ambulatory Visit: Payer: Self-pay

## 2021-08-09 ENCOUNTER — Ambulatory Visit (INDEPENDENT_AMBULATORY_CARE_PROVIDER_SITE_OTHER): Payer: Medicare Other | Admitting: Pulmonary Disease

## 2021-08-09 ENCOUNTER — Encounter: Payer: Self-pay | Admitting: Pulmonary Disease

## 2021-08-09 DIAGNOSIS — J449 Chronic obstructive pulmonary disease, unspecified: Secondary | ICD-10-CM

## 2021-08-09 DIAGNOSIS — J9611 Chronic respiratory failure with hypoxia: Secondary | ICD-10-CM | POA: Diagnosis not present

## 2021-08-09 DIAGNOSIS — J479 Bronchiectasis, uncomplicated: Secondary | ICD-10-CM | POA: Diagnosis not present

## 2021-08-09 NOTE — Progress Notes (Signed)
   Subjective:    Patient ID: Brenda Duncan, female    DOB: 25-Feb-1934, 87 y.o.   MRN: [id number]  HPI  87 yo ex-smoker for FU of  COPD, bronchiectasis and chronic respiratory failure    PMH -osteoporosis, status post kyphoplasty T-spine, hypertension , DME [facility] , Crohn's disease and hypothyroidism She smoked about 40 pack years before she quit 2016   Chief Complaint  Patient presents with   Follow-up    Using 2LO2 oxygen at night time only. Uses inhalers consistently but feels that she may want to come off inhaler. Reports that SOB has improved.    She has weaned herself off oxygen.  We will check nocturnal oximetry and she continues to desaturate during sleep so advised her to stay on nocturnal oxygen.  She is compliant with this. She called with complaints about Breztri inhaler stating that her legs feel like they are rubbery.  She also developed a rash some other medications were stopped.  She wonders if she still needs the inhaler.  She has only used the rescue MDI about once over the last few months  Significant tests/ events reviewed 02/2021 ONO/RA showed sig desaturation < 88% for 2 h out of 7h of sleep  Suggest , she stay on oxygen during sleep Spirometry 12/2016 ratio 52, FEV1 62%/1.13, FVC 80%, no bronchodilator response   CT chest 01/2018 right middle lobe 5 mm groundglass pulmonary nodule unchanged from 08/2017, changes of emphysema   Review of Systems neg for any significant sore throat, dysphagia, itching, sneezing, nasal congestion or excess/ purulent secretions, fever, chills, sweats, unintended wt loss, pleuritic or exertional cp, hempoptysis, orthopnea pnd or change in chronic leg swelling. Also denies presyncope, palpitations, heartburn, abdominal pain, nausea, vomiting, diarrhea or change in bowel or urinary habits, dysuria,hematuria, rash, arthralgias, visual complaints, headache, numbness weakness or ataxia.     Objective:   Physical Exam  Gen.  Pleasant, well-nourished,elderly, in no distress ENT - no thrush, no pallor/icterus,no post nasal drip Neck: No JVD, no thyromegaly, no carotid bruits Lungs: no use of accessory muscles, no dullness to percussion, clear without rales or rhonchi  Cardiovascular: Rhythm regular, heart sounds  normal, no murmurs or gallops, no peripheral edema Musculoskeletal: No deformities, no cyanosis or clubbing        Assessment & Plan:

## 2021-08-09 NOTE — Assessment & Plan Note (Signed)
Nocturnal oximetry continues to show desaturation for 4 hours. She will continue nocturnal oxygen.  We will revisit this with ONO next year

## 2021-08-09 NOTE — Assessment & Plan Note (Signed)
We discussed signs and symptoms of early exacerbation for which she will call and would need an antibiotic with or without prednisone

## 2021-08-09 NOTE — Patient Instructions (Signed)
Stay on Breztri Use oxygen at night Call us if you get a chest cold

## 2021-08-09 NOTE — Assessment & Plan Note (Signed)
I emphasized that we should continue on Breztri, this has really cut down her use of rescue MDI and has helped wean her off prednisone. She would like to trial this once daily and I think that would be okay.  I explained to her that she needs to increase to twice daily for 24-hour effect. She will use rescue MDI on an as-needed basis

## 2021-08-16 ENCOUNTER — Other Ambulatory Visit (INDEPENDENT_AMBULATORY_CARE_PROVIDER_SITE_OTHER): Payer: Self-pay

## 2021-08-16 DIAGNOSIS — K3189 Other diseases of stomach and duodenum: Secondary | ICD-10-CM

## 2021-09-12 ENCOUNTER — Encounter (HOSPITAL_COMMUNITY): Payer: Self-pay | Admitting: Radiology

## 2021-09-12 ENCOUNTER — Other Ambulatory Visit: Payer: Self-pay

## 2021-09-12 ENCOUNTER — Ambulatory Visit (HOSPITAL_COMMUNITY)
Admission: RE | Admit: 2021-09-12 | Discharge: 2021-09-12 | Disposition: A | Discharge status: Home / Self Care | Payer: Medicare Other | Source: Ambulatory Visit | Attending: Internal Medicine | Admitting: Internal Medicine

## 2021-09-12 DIAGNOSIS — K3189 Other diseases of stomach and duodenum: Secondary | ICD-10-CM | POA: Diagnosis not present

## 2021-09-12 LAB — POCT I-STAT CREATININE: Creatinine, Ser: 0.6 mg/dL (ref 0.44–1.00)

## 2021-09-12 MED ORDER — IOHEXOL 300 MG/ML  SOLN
100.0000 mL | Freq: Once | INTRAMUSCULAR | Status: AC | PRN
  Administered 2021-09-12: 100 mL via INTRAVENOUS

## 2021-10-03 ENCOUNTER — Ambulatory Visit (INDEPENDENT_AMBULATORY_CARE_PROVIDER_SITE_OTHER): Payer: Medicare Other | Admitting: Internal Medicine

## 2021-10-09 ENCOUNTER — Ambulatory Visit (INDEPENDENT_AMBULATORY_CARE_PROVIDER_SITE_OTHER): Payer: Medicare Other | Admitting: Internal Medicine

## 2021-10-09 ENCOUNTER — Encounter (INDEPENDENT_AMBULATORY_CARE_PROVIDER_SITE_OTHER): Payer: Self-pay | Admitting: Internal Medicine

## 2021-10-09 ENCOUNTER — Other Ambulatory Visit: Payer: Self-pay

## 2021-10-09 VITALS — BP 148/72 | HR 87 | Temp 98.5°F | Ht 67.0 in | Wt 141.8 lb

## 2021-10-09 DIAGNOSIS — R1032 Left lower quadrant pain: Secondary | ICD-10-CM | POA: Diagnosis not present

## 2021-10-09 DIAGNOSIS — K5000 Crohn's disease of small intestine without complications: Secondary | ICD-10-CM

## 2021-10-09 DIAGNOSIS — K8020 Calculus of gallbladder without cholecystitis without obstruction: Secondary | ICD-10-CM

## 2021-10-09 NOTE — Progress Notes (Addendum)
Presenting complaint;  History of small bowel Crohn's disease. History of left upper quadrant/epigastric pain.  Database and subjective:  Patient is 87 year old Caucasian female who is here for scheduled visit. She was last seen in the office on 05/25/2021 for left-sided abdominal pain and abnormal CT which revealed anterior gastric wall thickening and inflammatory changes anterior to the stomach.  She underwent EGD on 06/08/2021 and found to have H. pylori gastritis.  She was treated with PPI tetracycline metronidazole and Pepto-Bismol for 10 days. She had follow-up abdominal pelvic CT on 09/12/2021 and inflammatory changes anterior to the stomach completely resolved.  She was felt to have small area of chronic fat necrosis anterior to the stomach.  Study also revealed cholelithiasis. Review of CT from June 2022 also revealed cholelithiasis.  She also has a history of small bowel Crohn's disease with remote surgery which required resection of diseased segment of small bowel.  She has been maintained on infliximab infusion for maintenance until she developed pneumonia in February 2018 when infliximab was discontinued.  Luckily she does not have relapse of her symptoms.  She says she is doing well.  On most days she has 2-3 bowel movements.  Stools are mushy soft.  Stool consistency has been like this since her bowel surgery several years ago.  She denies urgency melena or rectal bleeding or nocturnal diarrhea.  She also denies abdominal pain.  She takes Imodium no more than once a week. Her appetite is very good.  She says she does not do any regular physical activity but she does housework and stays busy. She says she is using rescue inhaler infrequently.  Current Medications: Outpatient Encounter Medications as of 10/09/2021  Medication Sig   albuterol (PROVENTIL) (2.5 MG/3ML) 0.083% nebulizer solution Take 3 mLs (2.5 mg total) by nebulization in the morning and at bedtime.   amLODipine  (NORVASC) 2.5 MG tablet Take 2.5 mg by mouth daily.   Budeson-Glycopyrrol-Formoterol (BREZTRI AEROSPHERE) 160-9-4.8 MCG/ACT AERO Inhale 2 puffs into the lungs 2 (two) times daily.   clobetasol cream (TEMOVATE) 5.73 % Apply 1 application topically daily. After bathing.   ergocalciferol (VITAMIN D2) 50000 UNITS capsule Take 50,000 Units by mouth every Friday.   loperamide (IMODIUM) 2 MG capsule Take 2-4 mg by mouth 4 (four) times daily as needed for diarrhea or loose stools.   losartan (COZAAR) 50 MG tablet Take 50 mg by mouth daily after supper.   OXYGEN Inhale 2 L into the lungs at bedtime.   SYNTHROID 150 MCG tablet Take 150 mcg by mouth daily before breakfast.    triamcinolone cream (KENALOG) 0.1 % Apply 1 application topically daily. After bathing.   VENTOLIN HFA 108 (90 Base) MCG/ACT inhaler Inhale 2 puffs into the lungs every 6 (six) hours as needed for shortness of breath or wheezing.   [DISCONTINUED] Calcium Carb-Cholecalciferol (CALCIUM + D3 PO) Take 1 tablet by mouth daily with lunch.   [DISCONTINUED] Magnesium 500 MG TABS Take 500 mg by mouth daily with supper.   No facility-administered encounter medications on file as of 10/09/2021.     Objective: Blood pressure (!) 148/72, pulse 87, temperature 98.5 F (36.9 C), temperature source Oral, height 5' 7"  (1.702 m), weight 141 lb 12.8 oz (64.3 kg). Patient is alert and in no acute distress. Conjunctiva is pink. Sclera is nonicteric Oropharyngeal mucosa is normal. She has upper and lower partial plates No neck masses or thyromegaly noted. Cardiac exam with regular rhythm normal S1 and S2. No murmur or gallop noted.  Lungs are clear to auscultation. Abdomen is symmetrical.  She has no midline scar.  On palpation abdomen is soft and nontender without organomegaly or masses. No LE edema or clubbing noted.  Labs/studies Results:  Abdominal pelvic CT with contrast from 09/12/2021 Stable postop changes from distal small bowel  resection. No evidence of active Crohn disease or other acute findings.   Cholelithiasis. No radiographic evidence of cholecystitis.   Stable benign left adrenal adenoma.   4 mm right middle lobe pulmonary nodule. No routine follow-up imaging is recommended per Fleischner Society Guidelines. These guidelines do not apply to immunocompromised patients and patients with cancer. Follow up in patients with significant comorbidities as clinically warranted. For lung cancer screening, adhere to Lung-RADS guidelines. Reference: Radiology. 2017; 284(1):228-43.1  No recent lab data available. Patient will send [place] a copy of her recent blood work which she has at home.  Assessment:  #1.  Left upper quadrant/epigastric pain has resolved.  Based on CT of June 2022 and a follow-up study this month she most likely had acute appendagitis/fat necrosis resulting in pain.  Inflammatory changes have resolved on a follow-up CT this month. Work-up also revealed her to have H. pylori gastritis for which she has been treated.  #2.  History of small bowel Crohn's disease.  She remains in remission.  Infliximab was stopped in February 2018 because of protracted illness with pneumonia. Reassuring to note that no bowel wall thickening noted on CT of June and November 2022.  We will continue to monitor for symptoms of relapse.  #3.  Asymptomatic cholelithiasis.  Patient educated as to what the typical symptoms are and if she has any she will contact her office.  Plan:  Patient advised not to take NSAIDs. Patient will call if stool frequency changes if she has rectal bleeding. Office visit in 1 year.

## 2021-10-09 NOTE — Patient Instructions (Signed)
Notify if you have epigastric or right upper quadrant abdominal pain after meals or if you have shoulder or right scapular pain.

## 2021-10-10 ENCOUNTER — Ambulatory Visit (INDEPENDENT_AMBULATORY_CARE_PROVIDER_SITE_OTHER): Payer: Medicare Other | Admitting: Gastroenterology

## 2021-10-13 ENCOUNTER — Telehealth (INDEPENDENT_AMBULATORY_CARE_PROVIDER_SITE_OTHER): Payer: Self-pay | Admitting: Internal Medicine

## 2021-10-13 NOTE — Telephone Encounter (Signed)
I made patient aware that I have reviewed her blood work that she left for me. LFTs are normal.  Hemoglobin is mildly decreased at 11.4.

## 2021-10-16 ENCOUNTER — Encounter (INDEPENDENT_AMBULATORY_CARE_PROVIDER_SITE_OTHER): Payer: Self-pay

## 2021-11-08 ENCOUNTER — Telehealth: Payer: Self-pay | Admitting: Pulmonary Disease

## 2021-11-08 DIAGNOSIS — J441 Chronic obstructive pulmonary disease with (acute) exacerbation: Secondary | ICD-10-CM

## 2021-11-08 NOTE — Telephone Encounter (Signed)
Patient took a covid test at home and it was negative.  Patient is going to get flu test. Patient will call in the morning and let us know if flu test is negative.

## 2021-11-08 NOTE — Telephone Encounter (Signed)
Primary Pulmonologist: Dr. Vassie Loll  Last office visit and with whom: 08/09/2021 Dr. Vassie Loll  What do we see them for (pulmonary problems): COPD. Bronchiectasis. Chronic resp failure  Last OV assessment/plan: see below   Was appointment offered to patient (explain)?  No. None available in [facility] office.    Reason for call:  Starting last night and worsened this morning.  Headache  Runny nose  Chest tightness   Cough with clear mucus.  No fever.  No covid or flu test.    Dr. Vassie Loll on vacation. Will sent to DOD Dr. Francine Graven.  Allergies  Allergen Reactions   Ipratropium Itching    Immunization History  Administered Date(s) Administered   Moderna Sars-Covid-2 Vaccination 11/23/2019, 12/21/2019, 09/05/2020    Assessment & Plan:           Assessment & Plan Note by Oretha Milch, MD at 08/09/2021 9:15 AM  Author: Oretha Milch, MD Author Type: Physician Filed: 08/09/2021  9:15 AM  Note Status: Written Cosign: Cosign Not Required Encounter Date: 08/09/2021  Problem: Bronchiectasis without complication [facility])  Editor: Oretha Milch, MD (Physician)             We discussed signs and symptoms of early exacerbation for which she will call and would need an antibiotic with or without prednisone        Assessment & Plan Note by Oretha Milch, MD at 08/09/2021 9:14 AM  Author: Oretha Milch, MD Author Type: Physician Filed: 08/09/2021  9:15 AM  Note Status: Written Cosign: Cosign Not Required Encounter Date: 08/09/2021  Problem: COPD with chronic bronchitis and emphysema (HCC)  Editor: Oretha Milch, MD (Physician)             I emphasized that we should continue on Breztri, this has really cut down her use of rescue MDI and has helped wean her off prednisone. She would like to trial this once daily and I think that would be okay.  I explained to her that she needs to increase to twice daily for 24-hour effect. She will use rescue MDI on an as-needed basis        Assessment & Plan Note  by Oretha Milch, MD at 08/09/2021 9:14 AM  Author: Oretha Milch, MD Author Type: Physician Filed: 08/09/2021  9:14 AM  Note Status: Written Cosign: Cosign Not Required Encounter Date: 08/09/2021  Problem: Chronic respiratory failure with hypoxia [facility])  Editor: Oretha Milch, MD (Physician)             Nocturnal oximetry continues to show desaturation for 4 hours. She will continue nocturnal oxygen.  We will revisit this with ONO next year        Patient Instructions by Oretha Milch, MD at 08/09/2021 9:00 AM  Author: Oretha Milch, MD Author Type: Physician Filed: 08/09/2021  9:11 AM  Note Status: Signed Cosign: Cosign Not Required Encounter Date: 08/09/2021  Editor: Oretha Milch, MD (Physician)             Stay on [place] Use oxygen at night Call us if you get a chest cold

## 2021-11-09 ENCOUNTER — Telehealth: Payer: Self-pay | Admitting: Pulmonary Disease

## 2021-11-09 MED ORDER — DOXYCYCLINE HYCLATE 100 MG PO TABS
100.0000 mg | ORAL_TABLET | Freq: Two times a day (BID) | ORAL | 0 refills | Status: AC
Start: 2021-11-09 — End: 2021-11-16

## 2021-11-09 MED ORDER — PREDNISONE 10 MG PO TABS: 20.0000 mg | ORAL_TABLET | Freq: Every day | ORAL | 0 refills | Status: AC

## 2021-11-09 NOTE — Addendum Note (Signed)
Addended by: Carleene Mains D on: 11/09/2021 04:10 PM   Modules accepted: Orders

## 2021-11-09 NOTE — Telephone Encounter (Signed)
Called and spoke to patient. Gave her Dr. Jodene Nam recs. She voiced understanding.   Medications sent to [facility] as req. By patient. Nothing further needed.

## 2021-11-09 NOTE — Telephone Encounter (Signed)
Response to encounter sent in sick call encounter from yesterday 11/08/21. Will close this encounter.

## 2021-11-09 NOTE — Telephone Encounter (Signed)
Patient states she did not take a flu test but she is feeling slightly better. Covid test negative. States she would like some medication called in for a cough or an antibiotic if possible.   Sending to DOD Dr. Judeth Horn since Dr. Vassie Loll is off this week.   Dr. Judeth Horn please advise.   Thanks!

## 2021-11-09 NOTE — Telephone Encounter (Signed)
Flu test would have been helpful to know if flu as if so would use tamiflu to treat. Now approaching 48 hours of symptoms so flu medications will be of no benefit. Prednisone 20 mg daily x 5 days and doxycyline 100 mg bid x 7 days for COPD exacerbation, please send to preferred pharmacy.

## 2021-11-09 NOTE — Telephone Encounter (Signed)
ATC patient x3 and received bust signal.  Will call back.

## 2021-11-28 ENCOUNTER — Other Ambulatory Visit: Payer: Self-pay | Admitting: Pulmonary Disease

## 2021-11-29 ENCOUNTER — Other Ambulatory Visit: Payer: Self-pay | Admitting: Pulmonary Disease

## 2021-12-04 ENCOUNTER — Other Ambulatory Visit: Payer: Self-pay

## 2021-12-04 ENCOUNTER — Telehealth: Payer: Self-pay | Admitting: Pulmonary Disease

## 2021-12-04 MED ORDER — ALBUTEROL SULFATE (2.5 MG/3ML) 0.083% IN NEBU: 2.5000 mg | INHALATION_SOLUTION | Freq: Two times a day (BID) | RESPIRATORY_TRACT | 6 refills | Status: DC

## 2021-12-04 NOTE — Telephone Encounter (Signed)
Refill sent in for patient to [facility] [place]. Patient notified

## 2021-12-04 NOTE — Telephone Encounter (Signed)
Refill of Proventil sent for pt to [facility]. Patient notified

## 2022-01-15 ENCOUNTER — Ambulatory Visit (INDEPENDENT_AMBULATORY_CARE_PROVIDER_SITE_OTHER): Payer: Medicare Other | Admitting: Pulmonary Disease

## 2022-01-15 ENCOUNTER — Other Ambulatory Visit: Payer: Self-pay

## 2022-01-15 ENCOUNTER — Encounter: Payer: Self-pay | Admitting: Pulmonary Disease

## 2022-01-15 DIAGNOSIS — J9611 Chronic respiratory failure with hypoxia: Secondary | ICD-10-CM

## 2022-01-15 DIAGNOSIS — J449 Chronic obstructive pulmonary disease, unspecified: Secondary | ICD-10-CM | POA: Diagnosis not present

## 2022-01-15 NOTE — Progress Notes (Signed)
? ?  Subjective:  ? ? Patient ID: Brenda Duncan, female    DOB: Feb 22, 1934, 88 y.o.   MRN: [id number] ? ?HPI ? ?88 yo ex-smoker for FU of  COPD, bronchiectasis and chronic respiratory failure , on noct O2 ?  ?PMH -osteoporosis, status post kyphoplasty T-spine, hypertension , DME [facility] , Crohn's disease and hypothyroidism ?She smoked about 40 pack years before she quit 2016 ? ?Chief Complaint  ?Patient presents with  ? Follow-up  ?  Breathing is overall doing well. She states has some cough in the am after she using her neb- occ prod with clear sputum.   ? ?She continues on nocturnal oxygen, she is compliant and this is helped her.  She feels she cannot do without oxygen.  She is planning her first trip to [place] to visit her daughter and she has oxygen all set up. ? ?She developed influenza-like illness 10/2021, got better with doxycycline and prednisone was called in. ?She is compliant with Judithann Sauger, takes this only once daily ?Has not needed rescue albuterol ? ? ?Significant tests/ events reviewed ? ?02/2021 ONO/RA showed sig desaturation < 88% for 2 h out of 7h of sleep >> Suggest , she stay on oxygen during sleep ? ?Spirometry 12/2016 ratio 52, FEV1 62%/1.13, FVC 80%, no bronchodilator response ?  ?CT chest 01/2018 right middle lobe 5 mm groundglass pulmonary nodule unchanged from 08/2017, changes of emphysema ? ?Review of Systems ? ?neg for any significant sore throat, dysphagia, itching, sneezing, nasal congestion or excess/ purulent secretions, fever, chills, sweats, unintended wt loss, pleuritic or exertional cp, hempoptysis, orthopnea pnd or change in chronic leg swelling. Also denies presyncope, palpitations, heartburn, abdominal pain, nausea, vomiting, diarrhea or change in bowel or urinary habits, dysuria,hematuria, rash, arthralgias, visual complaints, headache, numbness weakness or ataxia. ? ?   ?Objective:  ? Physical Exam ? ?Gen. Pleasant, elderly,well-nourished, in no distress ?ENT - no thrush, no  pallor/icterus,no post nasal drip ?Neck: No JVD, no thyromegaly, no carotid bruits ?Lungs: no use of accessory muscles, no dullness to percussion, clear without rales or rhonchi  ?Cardiovascular: Rhythm regular, heart sounds  normal, no murmurs or gallops, no peripheral edema ?Musculoskeletal: No deformities, no cyanosis or clubbing  ? ? ? ?   ?Assessment & Plan:  ? ? ?

## 2022-01-15 NOTE — Assessment & Plan Note (Addendum)
She feels comfortable with having oxygen around and is compliant with using this every night.  She feels this is helped her and she feels better and has less fatigue during the day ?She would like to hold off on nocturnal oximetry testing ?

## 2022-01-15 NOTE — Patient Instructions (Signed)
?  X refills on breztri ?

## 2022-01-15 NOTE — Assessment & Plan Note (Signed)
Refills will be provided on Breztri. ?We discussed that this is ideally a twice daily medication but the current regimen has worked well for her.  She will use albuterol only on an as-needed basis. ?We again discussed a COPD action plan for flareup ?

## 2022-01-16 ENCOUNTER — Other Ambulatory Visit: Payer: Self-pay

## 2022-01-16 ENCOUNTER — Telehealth: Payer: Self-pay

## 2022-01-16 MED ORDER — ALBUTEROL SULFATE 0.63 MG/3ML IN NEBU: 1.0000 | INHALATION_SOLUTION | Freq: Four times a day (QID) | RESPIRATORY_TRACT | 0 refills | Status: DC | PRN

## 2022-01-16 NOTE — Telephone Encounter (Signed)
Received fax from [facility]: ? ?Patient req. 90 day supply of Neb solution Albuterol 0.083% 3ML but it is on backorder until May. However Albuterol .063% can be provided for patient with new Rx.  ? ?Dr. Elsworth Soho please advise.  ?

## 2022-01-16 NOTE — Telephone Encounter (Signed)
Correction ** .63 mg albuterol is what pharmacy can substitute.  ?

## 2022-01-16 NOTE — Telephone Encounter (Signed)
Order placed for albuterol neb solution. Nothing further needed ?

## 2022-01-25 ENCOUNTER — Telehealth: Payer: Self-pay | Admitting: Pulmonary Disease

## 2022-01-25 NOTE — Telephone Encounter (Signed)
Called and notified patient of Dr. Angus Palms recs. She voiced understanding. She is going to try the nebulizer solution for a week or so and will call back if its not working for her. Nothing further needed at this time.  ?

## 2022-01-25 NOTE — Telephone Encounter (Signed)
ATC patient (didn't notice the 12:00 note). Patient did not answer but will reattempt to call after 12.  ? ?In last phone note:  ?"From [facility] Patient req. 90 day supply of Neb solution Albuterol 0.083% 3ML but it is on backorder until May. However Albuterol .63 mg can be provided for patient with new Rx. "  ? ?Dr. Elsworth Soho approved substitution since original neb solution is on back order.  ? ?

## 2022-01-25 NOTE — Telephone Encounter (Signed)
Patient states she was aware of medication change and that it was due to regular nebs being on backorder.  ?Patient states that new nebulizer medication is not working like the other solution was.  ?She wants to know if she can take two vials of the .63 mg of albuterol at a time instead of one.  ? ?Dr. Elsworth Soho please advise  ?Sending HP since patient is wanting to take 2 vials at next dose  ?

## 2022-04-30 ENCOUNTER — Other Ambulatory Visit: Payer: Self-pay | Admitting: Pulmonary Disease

## 2022-05-01 ENCOUNTER — Telehealth: Payer: Self-pay

## 2022-05-01 MED ORDER — ALBUTEROL SULFATE 0.63 MG/3ML IN NEBU: INHALATION_SOLUTION | RESPIRATORY_TRACT | 1 refills | Status: AC

## 2022-05-01 NOTE — Telephone Encounter (Signed)
Received fax from [facility] in [place] asking for albuterol .63MG/3ML to be reordered with Dx code in notes.  Order placed with Dx code J44.9 for COPD.  Nothing further needed.

## 2022-07-30 ENCOUNTER — Encounter: Payer: Self-pay | Admitting: Pulmonary Disease

## 2022-07-30 ENCOUNTER — Ambulatory Visit (INDEPENDENT_AMBULATORY_CARE_PROVIDER_SITE_OTHER): Payer: Medicare Other | Admitting: Pulmonary Disease

## 2022-07-30 DIAGNOSIS — J449 Chronic obstructive pulmonary disease, unspecified: Secondary | ICD-10-CM

## 2022-07-30 DIAGNOSIS — J9611 Chronic respiratory failure with hypoxia: Secondary | ICD-10-CM

## 2022-07-30 NOTE — Patient Instructions (Addendum)
  Continue on Breztri once daily  - FLu shot

## 2022-07-30 NOTE — Assessment & Plan Note (Signed)
Continue nocturnal oxygen

## 2022-07-30 NOTE — Assessment & Plan Note (Signed)
Continue Breztri -K for her to use this once daily and use albuterol as needed for rescue. We discussed signs and symptoms of COPD exacerbation. We discussed COPD action plan

## 2022-07-30 NOTE — Progress Notes (Signed)
   Subjective:    Patient ID: Brenda Duncan, female    DOB: [birth year], 88 y.o.   MRN: [id number]  HPI  88 yo ex-smoker for FU of  COPD, bronchiectasis and chronic respiratory failure , on noct O2   PMH -osteoporosis, status post kyphoplasty T-spine, hypertension , DME [facility] , Crohn's disease and hypothyroidism She smoked about 40 pack years before she quit 2016  Chief Complaint  Patient presents with   Follow-up    Feels breathing has been doing well.  Using breztri but wants to talk about it     Overall breathing has been doing well.  She has decreased Breztri to once daily. She continues to use nocturnal oxygen and feels that it helps. There was a few days that she missed and she could feel the difference. She rarely needs albuterol  Significant tests/ events reviewed  02/2021 ONO/RA showed sig desaturation < 88% for 2 h out of 7h of sleep >> Suggest , she stay on oxygen during sleep   Spirometry 12/2016 ratio 52, FEV1 62%/1.13, FVC 80%, no bronchodilator response   CT chest 01/2018 right middle lobe 5 mm groundglass pulmonary nodule unchanged from 08/2017, changes of emphysema  Review of Systems  neg for any significant sore throat, dysphagia, itching, sneezing, nasal congestion or excess/ purulent secretions, fever, chills, sweats, unintended wt loss, pleuritic or exertional cp, hempoptysis, orthopnea pnd or change in chronic leg swelling. Also denies presyncope, palpitations, heartburn, abdominal pain, nausea, vomiting, diarrhea or change in bowel or urinary habits, dysuria,hematuria, rash, arthralgias, visual complaints, headache, numbness weakness or ataxia.     Objective:   Physical Exam  Gen. Pleasant, well-nourished, in no distress ENT - no thrush, no pallor/icterus,no post nasal drip Neck: No JVD, no thyromegaly, no carotid bruits Lungs: no use of accessory muscles, no dullness to percussion, clear without rales or rhonchi  Cardiovascular: Rhythm regular,  heart sounds  normal, no murmurs or gallops, no peripheral edema Musculoskeletal: No deformities, no cyanosis or clubbing         Assessment & Plan:

## 2022-11-17 IMAGING — DX DG CHEST 2V
2 series · 2 of 2 positions shown · non-contrast
Comparison: None.

CLINICAL DATA: COPD, shortness of breath

EXAM:
CHEST - 2 VIEW

[chest pa]
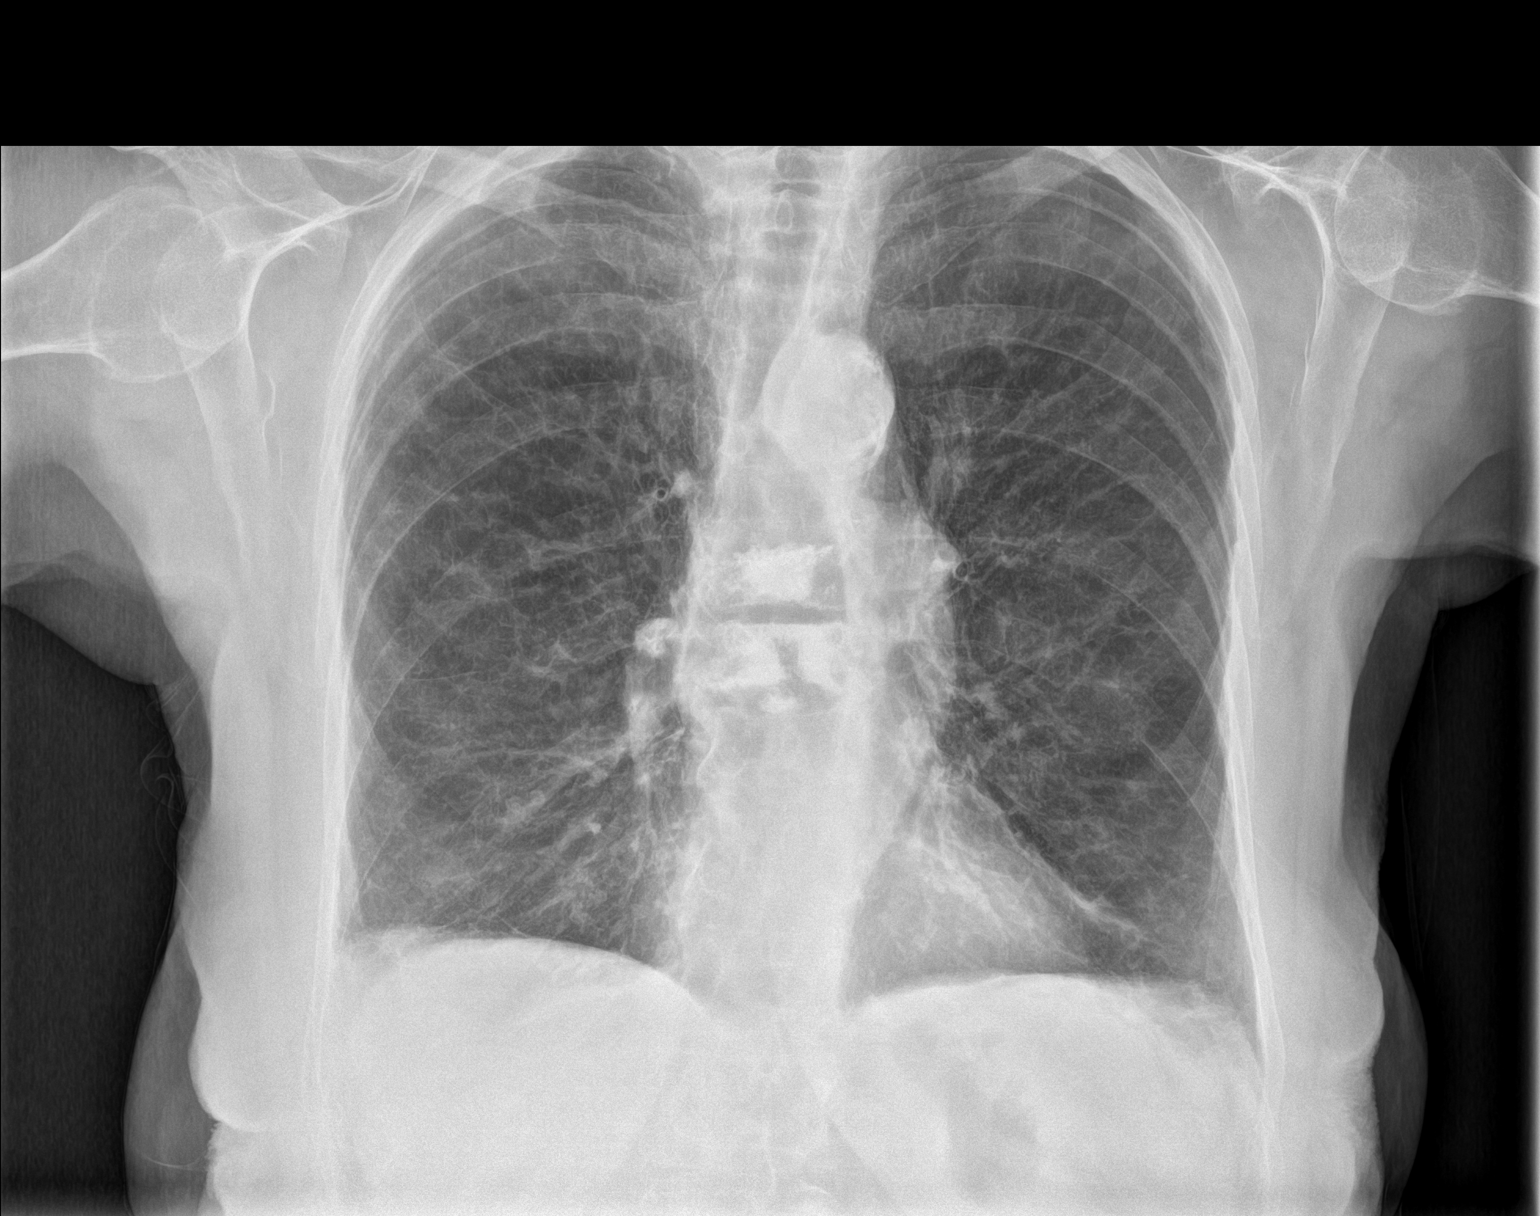

[chest lat]
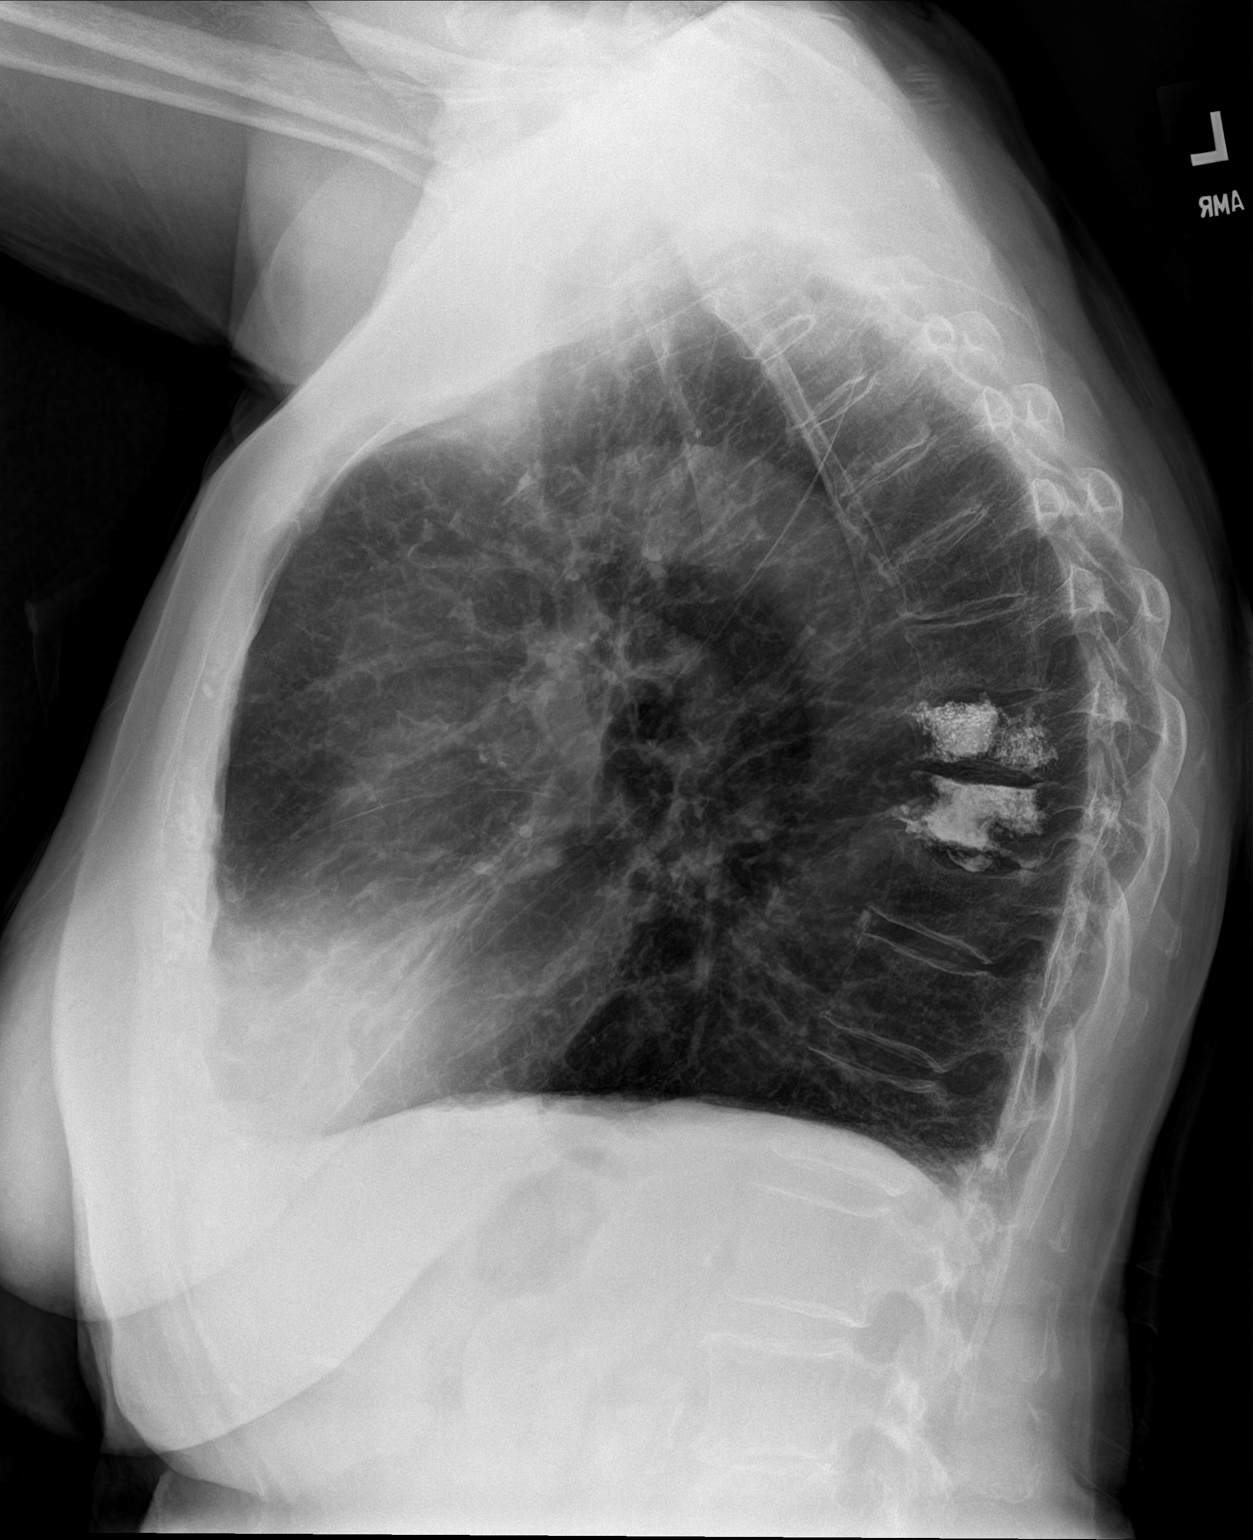

[2 of 2 positions shown; findings below may reference images not displayed]

FINDINGS: There is hyperinflation of the lungs compatible with COPD. Heart and
mediastinal contours are within normal limits. No focal opacities or
effusions. No acute bony abnormality. Prior vertebroplasty changes
in the midthoracic spine. Aortic atherosclerosis.
IMPRESSION: COPD.  No active cardiopulmonary disease.

## 2022-11-26 ENCOUNTER — Telehealth: Payer: Self-pay | Admitting: Pulmonary Disease

## 2022-11-26 NOTE — Telephone Encounter (Signed)
Recommend she start over the counter antihistamine (such as zyrtec or Claritin), flonase nasal spray and robitussin cough syrup.   No indication for abx or steriods at this time. If symptoms do not improve recommend visit in person

## 2022-11-26 NOTE — Telephone Encounter (Signed)
Primary Pulmonologist: Dr.A lva Last office visit and with whom: Dr. Elsworth Soho 07/30/2022 What do we see them for (pulmonary problems): Chronic resp failure / COPD  Last OV assessment/plan: see below   Was appointment offered to patient (explain)?  None available in [facility] office    Reason for call: Runny nose and cough with clear sputum since this morning.  Denies SOB/ wheezing/ fever.  Not taking anything OTC right now.  Patient wants to know if we can send something in for her to [facility] in [place].  She is aware since message was sent later in the day that we may call her tomorrow with message response.  Please advise.    Allergies  Allergen Reactions   Ipratropium Itching    Immunization History  Administered Date(s) Administered   COVID-19, mRNA, vaccine(Comirnaty)12 years and older 08/02/2022   Fluad Quad(high Dose 65+) 07/21/2019   Moderna Sars-Covid-2 Vaccination 11/23/2019, 12/21/2019, 09/05/2020    Assessment & Plan:           Assessment & Plan Note by Rigoberto Noel, MD at 07/30/2022 4:20 PM  Author: Rigoberto Noel, MD Author Type: Physician Filed: 07/30/2022  4:21 PM  Note Status: Written Cosign: Cosign Not Required Encounter Date: 07/30/2022  Problem: COPD with chronic bronchitis and emphysema ([place])  Editor: Rigoberto Noel, MD (Physician)             Continue Judithann Sauger -K for her to use this once daily and use albuterol as needed for rescue. We discussed signs and symptoms of COPD exacerbation. We discussed COPD action plan          Assessment & Plan Note by Rigoberto Noel, MD at 07/30/2022 4:20 PM  Author: Rigoberto Noel, MD Author Type: Physician Filed: 07/30/2022  4:20 PM  Note Status: Written Cosign: Cosign Not Required Encounter Date: 07/30/2022  Problem: Chronic respiratory failure with hypoxia ([place])  Editor: Rigoberto Noel, MD (Physician)             Continue nocturnal oxygen        Patient Instructions by Rigoberto Noel, MD at 07/30/2022 10:15  AM  Author: Rigoberto Noel, MD Author Type: Physician Filed: 07/30/2022 10:55 AM  Note Status: Addendum Cosign: Cosign Not Required Encounter Date: 07/30/2022  Editor: Rigoberto Noel, MD (Physician)      Prior Versions: 1. Rigoberto Noel, MD (Physician) at 07/30/2022 10:54 AM - Signed    Continue on [place] once daily   - FLu shot

## 2022-11-27 NOTE — Telephone Encounter (Signed)
I called and spoke with the pt and notified of response per [facility]. She verbalized understanding. Will call for appt if not improving.

## 2023-07-16 ENCOUNTER — Ambulatory Visit: Payer: Medicare Other | Admitting: Pulmonary Disease

## 2023-07-18 ENCOUNTER — Ambulatory Visit (INDEPENDENT_AMBULATORY_CARE_PROVIDER_SITE_OTHER): Payer: Medicare Other | Admitting: Pulmonary Disease

## 2023-07-18 ENCOUNTER — Encounter: Payer: Self-pay | Admitting: Pulmonary Disease

## 2023-07-18 VITALS — BP 132/87 | HR 85 | Ht 67.0 in | Wt 138.4 lb

## 2023-07-18 DIAGNOSIS — J439 Emphysema, unspecified: Secondary | ICD-10-CM | POA: Diagnosis not present

## 2023-07-18 DIAGNOSIS — J4489 Other specified chronic obstructive pulmonary disease: Secondary | ICD-10-CM | POA: Diagnosis not present

## 2023-07-18 DIAGNOSIS — J9611 Chronic respiratory failure with hypoxia: Secondary | ICD-10-CM | POA: Diagnosis not present

## 2023-07-18 NOTE — Assessment & Plan Note (Signed)
Her oxygen saturation is low borderline on ambulation. Will check nocturnal oximetry to see if she still needs oxygen

## 2023-07-18 NOTE — Addendum Note (Signed)
Addended by: Winn Jock on: 07/18/2023 12:30 PM   Modules accepted: Orders

## 2023-07-18 NOTE — Progress Notes (Signed)
   Subjective:    Patient ID: Brenda Duncan, female    DOB: [birth year], 89 y.o.   MRN: [id number]  HPI  89 yo ex-smoker for FU of  COPD, bronchiectasis and chronic respiratory failure , on noct O2   PMH -osteoporosis, status post kyphoplasty T-spine, hypertension , DME [place] , Crohn's disease and hypothyroidism She smoked about 40 pack years before she quit 2016  Annual follow-up visit She has been doing well.  She uses Breztri about 3 times a week has not needed rescue albuterol. She has in fact stopped using nocturnal oxygen and would like to return the equipment. Oxygen saturation on arrival was 91%.  On ambulation sat dropped lowest to 90% She only called [place] once in the interim 11/2022 with runny nose and clear sputum -this improved with Claritin OTC and Robitussin cough syrup  Significant tests/ events reviewed   02/2021 ONO/RA showed sig desaturation < 88% for 2 h out of 7h of sleep >> Suggest , she stay on oxygen during sleep   Spirometry 12/2016 ratio 52, FEV1 62%/1.13, FVC 80%, no bronchodilator response   CT chest 01/2018 right middle lobe 5 mm groundglass pulmonary nodule unchanged from 08/2017, changes of emphysema  Review of Systems neg for any significant sore throat, dysphagia, itching, sneezing, nasal congestion or excess/ purulent secretions, fever, chills, sweats, unintended wt loss, pleuritic or exertional cp, hempoptysis, orthopnea pnd or change in chronic leg swelling. Also denies presyncope, palpitations, heartburn, abdominal pain, nausea, vomiting, diarrhea or change in bowel or urinary habits, dysuria,hematuria, rash, arthralgias, visual complaints, headache, numbness weakness or ataxia.     Objective:   Physical Exam  Gen. Pleasant, well-nourished,elderly, in no distress ENT - no thrush, no pallor/icterus,no post nasal drip Neck: No JVD, no thyromegaly, no carotid bruits Lungs: no use of accessory muscles, no dullness to percussion, clear without rales  or rhonchi  Cardiovascular: Rhythm regular, heart sounds  normal, no murmurs or gallops, no peripheral edema Musculoskeletal: No deformities, no cyanosis or clubbing        Assessment & Plan:

## 2023-07-18 NOTE — Addendum Note (Signed)
Addended by: Winn Jock on: 07/18/2023 12:07 PM   Modules accepted: Orders

## 2023-07-18 NOTE — Assessment & Plan Note (Signed)
Continue Breztri. Use albuterol for rescue Refills will be provided as needed We discussed COPD action plan and signs and symptoms of COPD exacerbation

## 2023-07-18 NOTE — Patient Instructions (Signed)
Noct oximetry on RA - based on this , we will advise you about oxygen  Refills on breztri as needed

## 2023-08-02 IMAGING — CT CT ABD-PELV W/ CM
2 of 5 series · 16 of 46 positions shown, 18 images · IV contrast (Omnipaque or Isovue)
Comparison: Noncontrast CT on 05/06/2021 from [REDACTED]

CLINICAL DATA: Sharp left-sided abdominal pain for 5 months. Crohn
disease. Previous small-bowel resection.

EXAM:
CT ABDOMEN AND PELVIS WITH CONTRAST
TECHNIQUE: Multidetector CT imaging of the abdomen and pelvis was performed
using the standard protocol following bolus administration of
intravenous contrast.
CONTRAST:  100mL OMNIPAQUE IOHEXOL 300 MG/ML  SOLN

[Series 2: axial st · axial · 0.74mm/px · z∈[+912,+1297]mm · 13 of 87 slices shown, 15 images]
[im 5/87  soft-tissue]
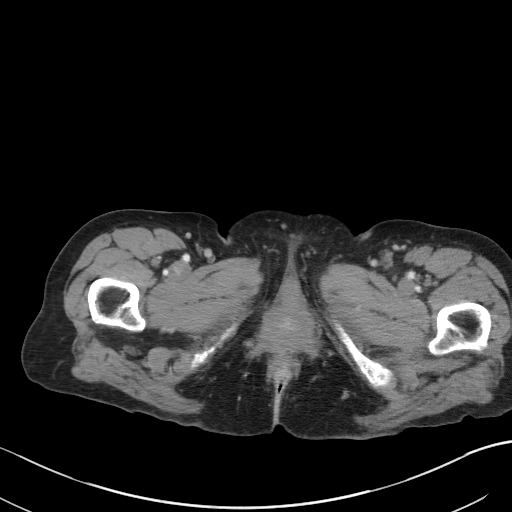
[im 5/87  bone]
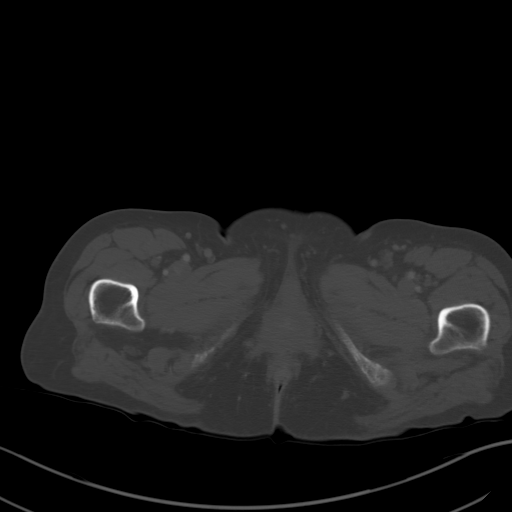
[im 10/87  soft-tissue]
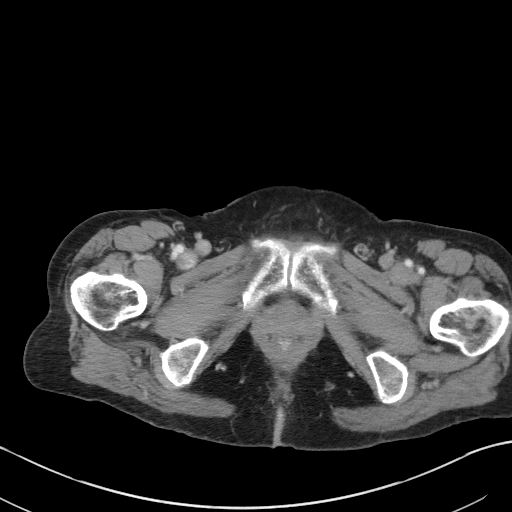
[im 20/87  soft-tissue]
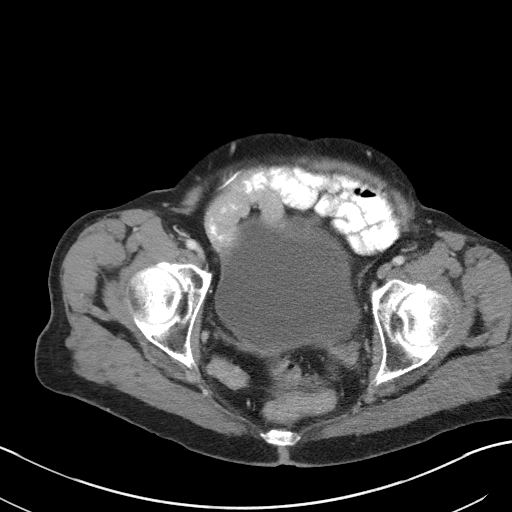
[im 24/87  soft-tissue]
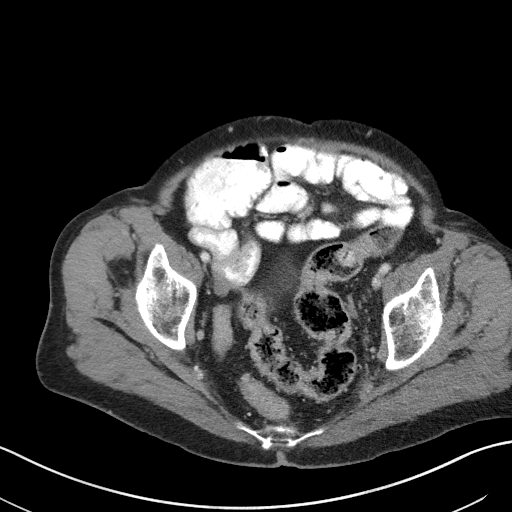
[im 29/87  soft-tissue]
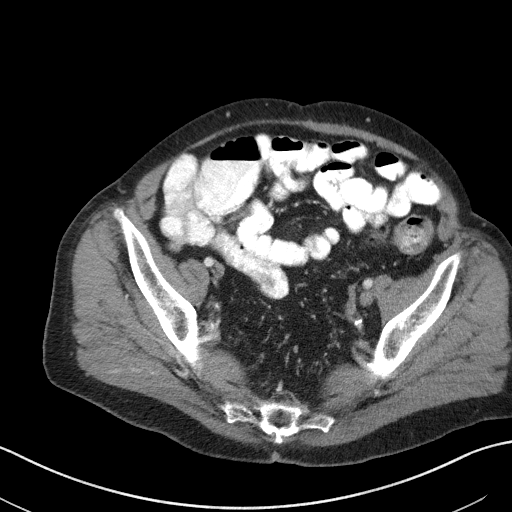
[im 39/87  soft-tissue]
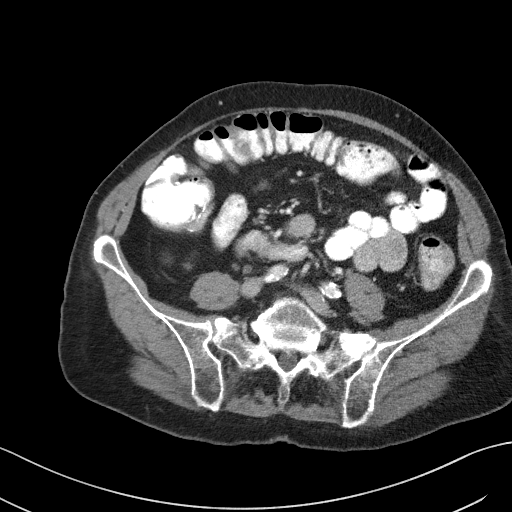
[im 44/87  soft-tissue]
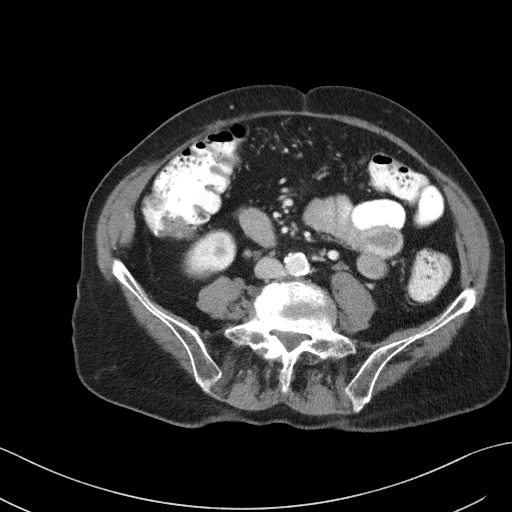
[im 48/87  soft-tissue]
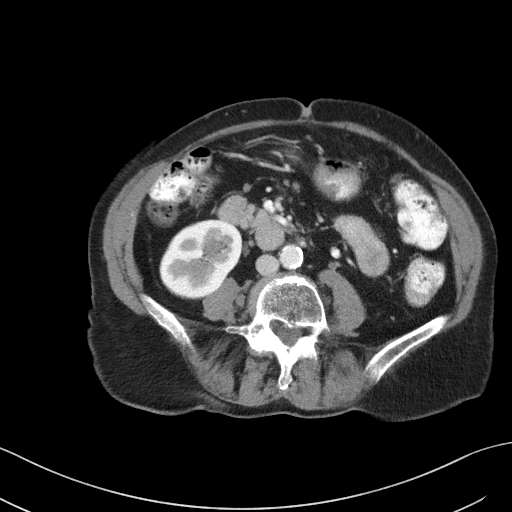
[im 58/87  soft-tissue]
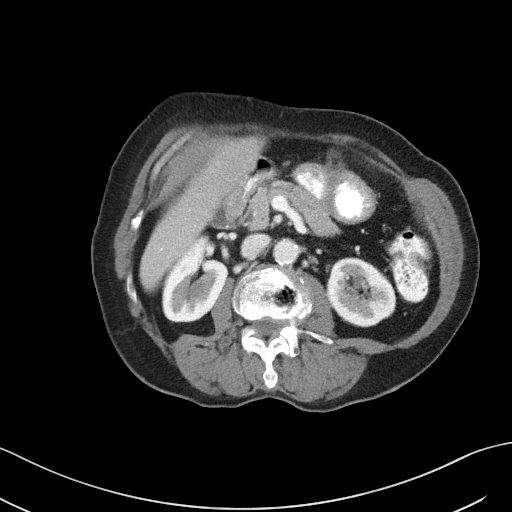
[im 58/87  bone]
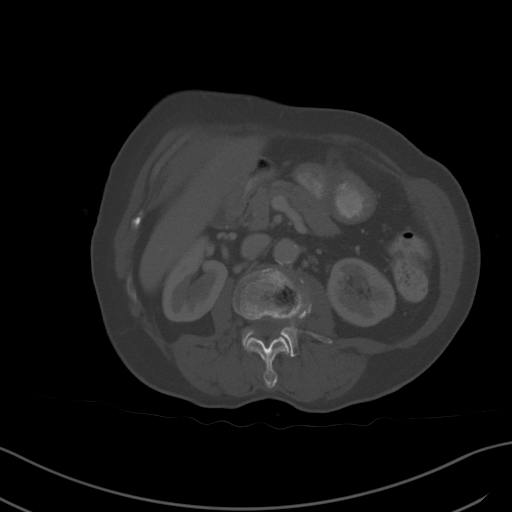
[im 63/87  soft-tissue]
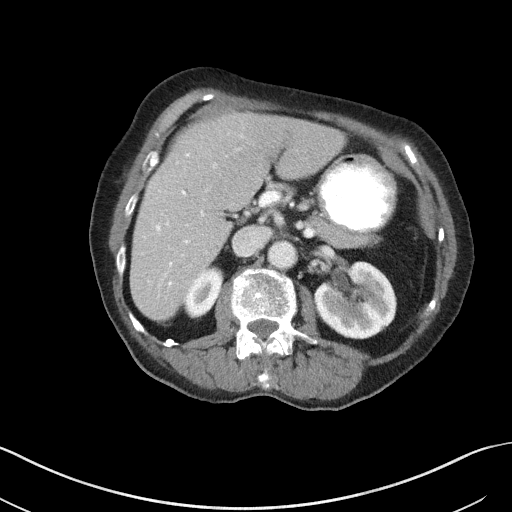
[im 67/87  soft-tissue]
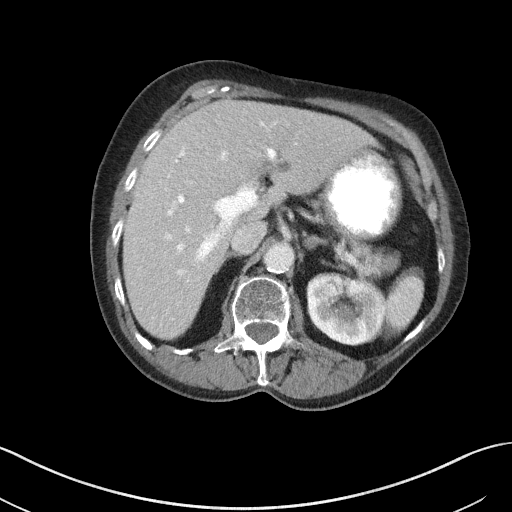
[im 77/87  soft-tissue]
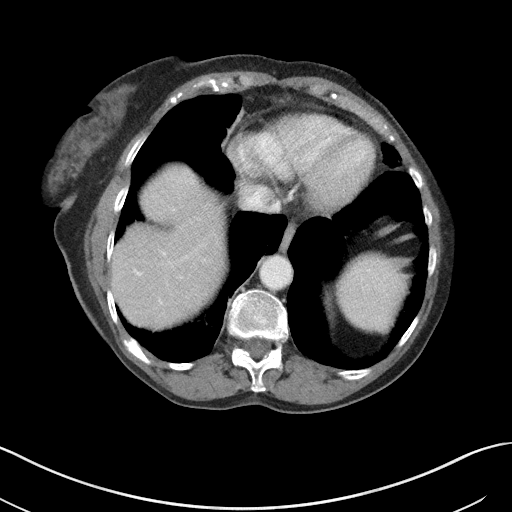
[im 82/87  soft-tissue]
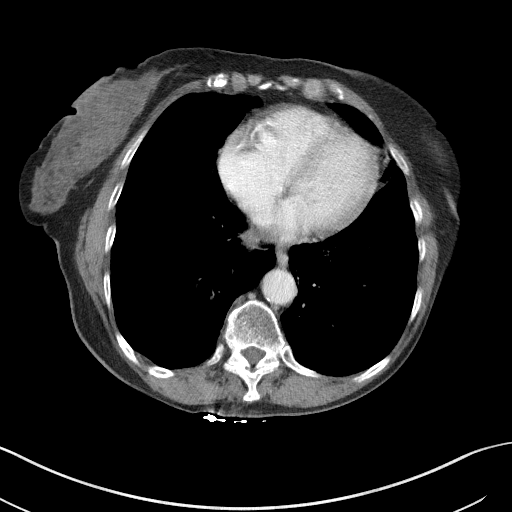

[Series 5: coronal st · coronal · 0.75mm/px · 3 of 117 slices shown]
[im 39/117  soft-tissue]
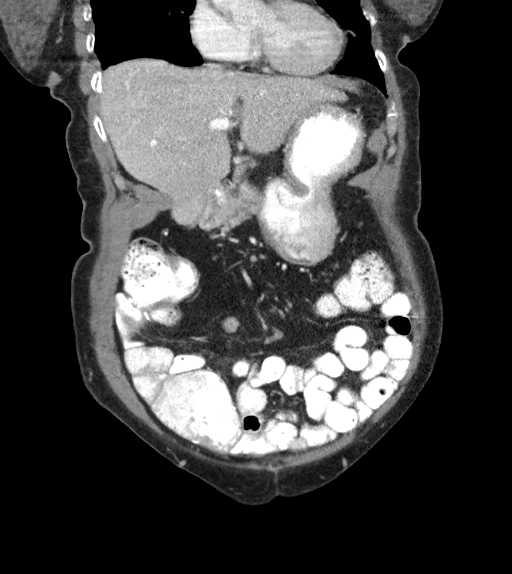
[im 52/117  soft-tissue]
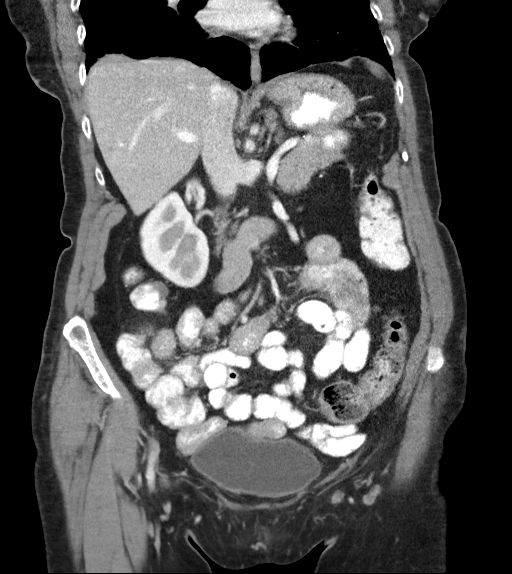
[im 65/117  soft-tissue]
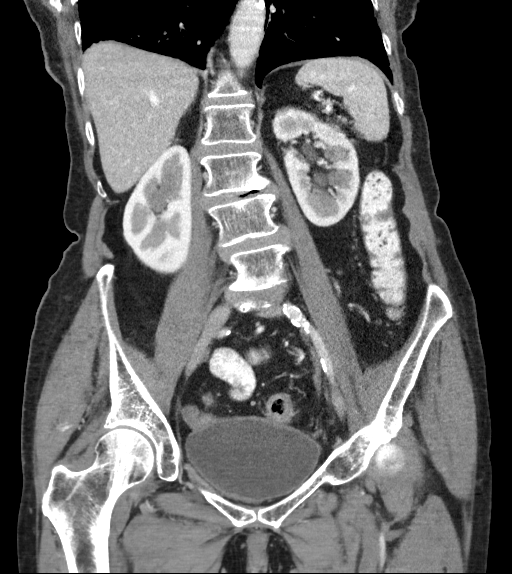

[16 of 46 positions shown; findings below may reference images not displayed]

FINDINGS: Lower Chest: 4 mm pulmonary nodule is seen in the inferior right
middle lobe on image [DATE], not definitely seen on prior study.

Hepatobiliary: No hepatic masses identified. Sub-cm cyst noted in
the lateral segment of the left hepatic lobe. A tiny less than 5 mm
calcified gallstone is noted, however, there is no evidence of
cholecystitis or biliary ductal dilatation.

Pancreas:  No mass or inflammatory changes.

Spleen: Within normal limits in size and appearance.

Adrenals/Urinary Tract: 1.4 cm left adrenal nodule is stable,
consistent with a benign adenoma. No renal masses identified. No
evidence of ureteral calculi or hydronephrosis. Unremarkable
unopacified urinary bladder.

Stomach/Bowel: Postop changes are again seen from prior distal small
bowel resection. No evidence of abnormal bowel wall thickening. No
evidence of obstruction, inflammatory process or abnormal fluid
collections.

Vascular/Lymphatic: No pathologically enlarged lymph nodes
identified. Several shotty right abdominal mesenteric lymph nodes
are stable, largest measuring 9 mm. Aortic atherosclerotic
calcification noted. No acute vascular findings.

Reproductive: Prior hysterectomy noted. Adnexal regions are
unremarkable in appearance.

Other: Small area of chronic fat necrosis is seen along anterior
margin of the gastric body, at site of previously seen inflammatory
changes.

Musculoskeletal:  No suspicious bone lesions identified.
IMPRESSION: Stable postop changes from distal small bowel resection. No evidence
of active Crohn disease or other acute findings.

Cholelithiasis. No radiographic evidence of cholecystitis.

Stable benign left adrenal adenoma.

4 mm right middle lobe pulmonary nodule. No routine follow-up
imaging is recommended per [HOSPITAL] Guidelines. These
guidelines do not apply to immunocompromised patients and patients
with cancer. Follow up in patients with significant comorbidities as
clinically warranted. For lung cancer screening, adhere to Lung-RADS
guidelines. Reference: Radiology. 5067; 284(1):228-43.1

Aortic Atherosclerosis ([id number]).

## 2023-09-27 ENCOUNTER — Telehealth: Payer: Self-pay | Admitting: Pulmonary Disease

## 2023-09-27 DIAGNOSIS — G4734 Idiopathic sleep related nonobstructive alveolar hypoventilation: Secondary | ICD-10-CM

## 2023-09-27 NOTE — Telephone Encounter (Signed)
ONO /RA showed sat <88 x 4h Continue on O2 during sleep for now

## 2023-09-30 NOTE — Telephone Encounter (Signed)
Called and spoke with patient she said that she understood results, however she has sent her oxygen back to [facility] in [place] because she was d/c from her oxygen. pt said that she will need a new order.

## 2023-10-01 NOTE — Telephone Encounter (Signed)
Order placed
